# Patient Record
Sex: Female | Born: 1975 | Race: Black or African American | Hispanic: No | Marital: Single | Smoking: Never smoker
Health system: Southern US, Community
[De-identification: ages and names within clinical notes are randomized; demographics above are authoritative.]

## PROBLEM LIST (undated history)

## (undated) DIAGNOSIS — F419 Anxiety disorder, unspecified: Secondary | ICD-10-CM

## (undated) DIAGNOSIS — E079 Disorder of thyroid, unspecified: Secondary | ICD-10-CM

## (undated) DIAGNOSIS — D649 Anemia, unspecified: Secondary | ICD-10-CM

## (undated) DIAGNOSIS — G43909 Migraine, unspecified, not intractable, without status migrainosus: Secondary | ICD-10-CM

## (undated) HISTORY — DX: Disorder of thyroid, unspecified: E07.9

## (undated) HISTORY — DX: Migraine, unspecified, not intractable, without status migrainosus: G43.909

## (undated) HISTORY — DX: Anemia, unspecified: D64.9

## (undated) HISTORY — DX: Anxiety disorder, unspecified: F41.9

## (undated) HISTORY — PX: TUBAL LIGATION: SHX77

---

## 2014-12-24 ENCOUNTER — Encounter: Payer: Self-pay | Admitting: Certified Nurse Midwife

## 2015-02-23 ENCOUNTER — Ambulatory Visit (INDEPENDENT_AMBULATORY_CARE_PROVIDER_SITE_OTHER): Payer: 59 | Admitting: Nurse Practitioner

## 2015-02-23 ENCOUNTER — Encounter: Payer: Self-pay | Admitting: Nurse Practitioner

## 2015-02-23 VITALS — BP 122/80 | HR 64 | Resp 16 | Ht 70.0 in | Wt 167.0 lb

## 2015-02-23 DIAGNOSIS — D509 Iron deficiency anemia, unspecified: Secondary | ICD-10-CM | POA: Diagnosis not present

## 2015-02-23 DIAGNOSIS — N760 Acute vaginitis: Secondary | ICD-10-CM | POA: Diagnosis not present

## 2015-02-23 DIAGNOSIS — N920 Excessive and frequent menstruation with regular cycle: Secondary | ICD-10-CM | POA: Diagnosis not present

## 2015-02-23 DIAGNOSIS — R319 Hematuria, unspecified: Secondary | ICD-10-CM | POA: Diagnosis not present

## 2015-02-23 DIAGNOSIS — Z113 Encounter for screening for infections with a predominantly sexual mode of transmission: Secondary | ICD-10-CM | POA: Diagnosis not present

## 2015-02-23 DIAGNOSIS — N390 Urinary tract infection, site not specified: Secondary | ICD-10-CM

## 2015-02-23 DIAGNOSIS — Z01419 Encounter for gynecological examination (general) (routine) without abnormal findings: Secondary | ICD-10-CM

## 2015-02-23 DIAGNOSIS — Z0000 Encounter for general adult medical examination without abnormal findings: Secondary | ICD-10-CM

## 2015-02-23 LAB — CBC
HEMATOCRIT: 31.1 % — AB (ref 36.0–46.0)
HEMOGLOBIN: 10.2 g/dL — AB (ref 12.0–15.0)
MCH: 24.1 pg — ABNORMAL LOW (ref 26.0–34.0)
MCHC: 32.8 g/dL (ref 30.0–36.0)
MCV: 73.3 fL — ABNORMAL LOW (ref 78.0–100.0)
MPV: 8.8 fL (ref 8.6–12.4)
Platelets: 330 10*3/uL (ref 150–400)
RBC: 4.24 MIL/uL (ref 3.87–5.11)
RDW: 18.1 % — AB (ref 11.5–15.5)
WBC: 7.8 10*3/uL (ref 4.0–10.5)

## 2015-02-23 LAB — POCT URINALYSIS DIPSTICK
Bilirubin, UA: NEGATIVE
GLUCOSE UA: NEGATIVE
Ketones, UA: NEGATIVE
NITRITE UA: POSITIVE
PROTEIN UA: NEGATIVE
UROBILINOGEN UA: NEGATIVE
pH, UA: 7

## 2015-02-23 LAB — IBC PANEL
%SAT: 9 % — ABNORMAL LOW (ref 11–50)
TIBC: 441 ug/dL (ref 250–450)
UIBC: 402 ug/dL — ABNORMAL HIGH (ref 125–400)

## 2015-02-23 LAB — IRON: Iron: 39 ug/dL — ABNORMAL LOW (ref 40–190)

## 2015-02-23 LAB — FERRITIN: Ferritin: 6 ng/mL — ABNORMAL LOW (ref 10–291)

## 2015-02-23 MED ORDER — NITROFURANTOIN MONOHYD MACRO 100 MG PO CAPS: 100.0000 mg | ORAL_CAPSULE | Freq: Two times a day (BID) | ORAL | Status: DC

## 2015-02-23 NOTE — Progress Notes (Signed)
Patient ID: Terry Tran, female   DOB: 1975/12/18, 39 y.o.   MRN: [id number] 39 y.o. [id number] Single  African American Fe here for NGYN annual exam.  Menses is usually 7 days. Heavy for 4 days with using super plus tampon and pad with changing every 2 hours.  Cramps for 2 days.  Aleve OTC with some relief. Recently will have to miss work or activities due to heavy bleeding.  Having to change clothing due to accidents.  Having a lot stress over loosing custody of 14 year old daughter.  With all the stress she had no menses in June.  She is post BTL.  Then had normal menses in July.  She then was with the father of her son (age 60) whom they have always remained in close contact and have a good relationship.  Then SA with him on 7/29 & 7/30 with increase pain at the introitus and with deep penetration.  Now with UTI symptoms and vaginal discharge.  Wants to get STD's. Did use condoms but came off. He is in the [employer] and she is unsure of his sexual history.  Patient's last menstrual period was 12/27/2014 (exact date).          Sexually active: No. Not currently. The current method of family planning is tubal ligation and condoms all of the time.    Exercising: Yes.    cardio, sit up and squats for 60 minutes five days per week Smoker:  no  Health Maintenance: Pap:  2012, normal, no history of abnormal pap TDaP:  12/28/14 Labs:  Dr. Barrie Folk  Urine:  2+ leuk's, pos Nitrites, trace RBC   reports that she has never smoked. She has never used smokeless tobacco. She reports that she drinks about 1.2 oz of alcohol per week. She reports that she does not use illicit drugs.  Past Medical History  Diagnosis Date  . Anemia   . Anxiety     Past Surgical History  Procedure Laterality Date  . Tubal ligation      Current Outpatient Prescriptions  Medication Sig Dispense Refill  . nitrofurantoin, macrocrystal-monohydrate, (MACROBID) 100 MG capsule Take 1 capsule (100 mg total) by mouth 2 (two) times  daily. 14 capsule 0  . sertraline (ZOLOFT) 50 MG tablet Take 1 tablet by mouth daily.  1  . traZODone (DESYREL) 50 MG tablet Take 1 tablet by mouth at bedtime as needed.  0   No current facility-administered medications for this visit.    No family history on file.  ROS:  Pertinent items are noted in HPI.  Otherwise, a comprehensive ROS was negative.  Exam:   BP 122/80 mmHg  Pulse 64  Resp 16  Ht 5\' 10"  (1.778 m)  Wt 167 lb (75.751 kg)  BMI 23.96 kg/m2  LMP 12/27/2014 (Exact Date) Height: 5\' 10"  (177.8 cm) Ht Readings from Last 3 Encounters:  02/23/15 5\' 10"  (1.778 m)    General appearance: alert, cooperative and appears stated age Head: Normocephalic, without obvious abnormality, atraumatic Neck: no adenopathy, supple, symmetrical, trachea midline and thyroid normal to inspection and palpation Lungs: clear to auscultation bilaterally Breasts: normal appearance, no masses or tenderness Heart: regular rate and rhythm Abdomen: soft, non-tender; no masses,  no organomegaly Extremities: extremities normal, atraumatic, no cyanosis or edema Skin: Skin color, texture, turgor normal. No rashes or lesions Lymph nodes: Cervical, supraclavicular, and axillary nodes normal. No abnormal inguinal nodes palpated Neurologic: Grossly normal   Pelvic: External genitalia:  no lesions  Urethra:  normal appearing urethra with no masses, tenderness or lesions              Bartholin's and Skene's: normal                 Vagina: normal appearing vagina with normal color and discharge, no lesions              Cervix: anteverted              Pap taken: Yes.   Bimanual Exam:  Uterus:  normal size, contour, position, consistency, mobility, non-tender              Adnexa: normal adnexa and positive for: tenderness but minimal over the bladder               Rectovaginal: Confirms               Anus:  normal sphincter tone, no lesions  Chaperone present: yes  A:  Well Woman with normal  exam  History of menorrhagia  S/P BTL  R/O STD's  UTI  Recent emotional stressors  History of anemia secondary to menorrhagia  P:   Reviewed health and wellness pertinent to exam  Pap smear as above  Mammogram is due age 77  Treated for UTI with Macrobid and will follow with urine C&S  Will follow with other labs, STD's, wet prep and anemia -she may get OTC Slow Fe  Discussed at length causes of menorrhagia including uterine fibroids.  Also discussed treatment options to help with menorrhagia including: OCP, IUD, Nexplanon, and ablation.  She is given information about HTA  Will order PUS/ SHGM with endo biopsy and follow.  She most likely will want a non hormonal option such as HTA  Counseled on breast self exam, mammography screening, adequate intake of calcium and vitamin D, diet and exercise, Kegel's exercises return annually or prn  An After Visit Summary was printed and given to the patient.

## 2015-02-23 NOTE — Patient Instructions (Addendum)

## 2015-02-24 LAB — URINALYSIS, MICROSCOPIC ONLY
CASTS: NONE SEEN [LPF]
Crystals: NONE SEEN [HPF]
YEAST: NONE SEEN [HPF]

## 2015-02-24 LAB — STD PANEL
HEP B S AG: NEGATIVE
HIV: NONREACTIVE

## 2015-02-24 LAB — WET PREP BY MOLECULAR PROBE
CANDIDA SPECIES: NEGATIVE
GARDNERELLA VAGINALIS: POSITIVE — AB
TRICHOMONAS VAG: NEGATIVE

## 2015-02-24 LAB — HEPATITIS C ANTIBODY: HCV Ab: NEGATIVE

## 2015-02-25 ENCOUNTER — Other Ambulatory Visit: Payer: Self-pay | Admitting: Nurse Practitioner

## 2015-02-25 LAB — IPS N GONORRHOEA AND CHLAMYDIA BY PCR

## 2015-02-25 LAB — IPS PAP TEST WITH HPV

## 2015-02-25 MED ORDER — METRONIDAZOLE 0.75 % VA GEL: 1.0000 | Freq: Every day | VAGINAL | Status: DC

## 2015-02-26 LAB — URINE CULTURE: Colony Count: >=100000

## 2015-02-27 NOTE — Progress Notes (Signed)
Encounter reviewed by Dr. Brook Amundson C. Silva.  

## 2015-03-07 ENCOUNTER — Telehealth: Payer: Self-pay | Admitting: Nurse Practitioner

## 2015-03-07 DIAGNOSIS — N920 Excessive and frequent menstruation with regular cycle: Secondary | ICD-10-CM

## 2015-03-07 NOTE — Telephone Encounter (Signed)
Called patient to review benefits for procedure. Left voicemail to call back and review. Attempting to schedule with Dr Quincy Simmonds 03/10/15 for SHGM/emb

## 2015-03-07 NOTE — Telephone Encounter (Signed)
Reviewed message.  Order placed for Sonohysterogram with Dr. Quincy Simmonds.  Patient with history of tubal ligation.  Routing to provider for final review. Patient agreeable to disposition. Will close encounter.

## 2015-03-07 NOTE — Telephone Encounter (Signed)
Spoke with patient. Reviewed benefit for procedure. Patient understood and agreeable. Scheduled 03/24/15 and verified arrival date/time for [facility] and endometrial biopsy procedure. Patient agreeable. Reviewed 72 hour cancellation policy with $753 non refundable fee. Patient understands and agreeable. Patient states she is unsure when her next cycle will start. Advised to let us know when starts but keep the 72 hour window in mind. Routing to nurse to review prior to closing.

## 2015-03-24 ENCOUNTER — Other Ambulatory Visit: Payer: 59 | Admitting: Obstetrics and Gynecology

## 2015-03-24 ENCOUNTER — Other Ambulatory Visit: Payer: 59

## 2015-04-11 ENCOUNTER — Telehealth: Payer: Self-pay | Admitting: Obstetrics and Gynecology

## 2015-04-11 NOTE — Telephone Encounter (Signed)
Thank you for the update.  Please keep in work que.   Cc- Rinaldo Ratel.

## 2015-04-11 NOTE — Telephone Encounter (Signed)
Patient canceled her ultrasound for 04/14/15. She states she will call back after the new year to reschedule this appointment.

## 2015-04-14 ENCOUNTER — Other Ambulatory Visit: Payer: 59

## 2015-04-14 ENCOUNTER — Other Ambulatory Visit: Payer: 59 | Admitting: Obstetrics and Gynecology

## 2015-05-04 ENCOUNTER — Telehealth: Payer: Self-pay | Admitting: Nurse Practitioner

## 2015-05-04 NOTE — Telephone Encounter (Addendum)
Patient wants to schedule an appointment for a ultrasound. She would like to schedule for January 2017. She originally had this scheduled but canceled.

## 2015-05-13 NOTE — Telephone Encounter (Signed)
Patient calling to check on status of ultrasound that she would like to have scheduled. [phone]

## 2015-06-30 ENCOUNTER — Ambulatory Visit (INDEPENDENT_AMBULATORY_CARE_PROVIDER_SITE_OTHER): Payer: 59 | Admitting: Obstetrics and Gynecology

## 2015-06-30 ENCOUNTER — Other Ambulatory Visit: Payer: Self-pay | Admitting: Obstetrics and Gynecology

## 2015-06-30 ENCOUNTER — Encounter: Payer: Self-pay | Admitting: Obstetrics and Gynecology

## 2015-06-30 ENCOUNTER — Ambulatory Visit (INDEPENDENT_AMBULATORY_CARE_PROVIDER_SITE_OTHER): Payer: 59

## 2015-06-30 VITALS — BP 120/76 | HR 64 | Ht 70.0 in | Wt 167.0 lb

## 2015-06-30 DIAGNOSIS — D259 Leiomyoma of uterus, unspecified: Secondary | ICD-10-CM

## 2015-06-30 DIAGNOSIS — N946 Dysmenorrhea, unspecified: Secondary | ICD-10-CM | POA: Diagnosis not present

## 2015-06-30 DIAGNOSIS — N920 Excessive and frequent menstruation with regular cycle: Secondary | ICD-10-CM | POA: Diagnosis not present

## 2015-06-30 LAB — CBC
HEMATOCRIT: 31.9 % — AB (ref 36.0–46.0)
HEMOGLOBIN: 10.7 g/dL — AB (ref 12.0–15.0)
MCH: 23.9 pg — AB (ref 26.0–34.0)
MCHC: 33.5 g/dL (ref 30.0–36.0)
MCV: 71.4 fL — ABNORMAL LOW (ref 78.0–100.0)
MPV: 8.9 fL (ref 8.6–12.4)
Platelets: 352 10*3/uL (ref 150–400)
RBC: 4.47 MIL/uL (ref 3.87–5.11)
RDW: 18.6 % — ABNORMAL HIGH (ref 11.5–15.5)
WBC: 11.3 10*3/uL — AB (ref 4.0–10.5)

## 2015-06-30 NOTE — Progress Notes (Signed)
Pt was given 800mg  Ibuprofen per verbal OK by Dr. Quincy Simmonds.  Pt was also given crackers as she has not eaten yet today.

## 2015-06-30 NOTE — Progress Notes (Signed)
Subjective  40 y.o. [id number]  African American  female here for pelvic ultrasound for heavy menses life long.  Considering endometrial ablation.   Used to have malaise and nausea with fever as a young woman.  Now cycles are preceded by headaches a week before her cycles come on.  Has "really bad cramps." Using Aleve, 2  every 4 hoours for first 3 days.  Cramping wakes patient up.  Back and "stomach" pains, which can prevent patient from going to work.  Has left work due to heaviness of flow.  Hgb 10.2 on 02/23/15. Can skip cycles.  Skipped in July. Does not remember August or September cycle or not.  Was under stress due to custody battle.   Status post BTL.  Never took birth control.  No hx of HTN, migraines with aura, personal or family hx of thromboembolic events.  Declines future childbearing.  Has had two vaginal deliveries.   Objective  Pelvic ultrasound images and report reviewed with patient.  Uterus - 10 mm subserosal fibroid.  Appearance consistent with adenomyosis.  EMS - 11.37 mm.  Echogenic focus 7 mm. Ovaries - bilateral follicles.  No masses. Free fluid - no  Procedure - sonohysterogram Consent performed. Speculum placed in vagina. Sterile prep of cervix with  betadine Cannula placed inside endometrial cavity. Speculum removed. Sterile saline injected.        small      filling defect noted but suboptimal distension.  Cannula removed and new cannula placed. Balloon tip canula with resistance.  Sponge tip cannula with slightly better dilation of uterine cavity.  No complication.   Procedure - endometrial biopsy Consent performed. Speculum place in vagina.  Sterile prep of cervix with  Betadine. Tenaculum to anterior cervical lip. Pipelle placed to 8 cm without difficulty twice. Tissue obtained and sent to pathology. Speculum removed.  No complications. Minimal EBL.  Assessment  Menorrhagia.  Hx anemia. Dysmenorrhea. Small fibroid. Status post  BTL.  Plan  CBC. Follow up EMB.  Instructions and precautions given.  Discussion of ultrasound findings - fibroid, endometrial thickening and possible polyp, possible adenomyosis.  Comprehensive discussion of options for care - contraceptive methods - pills, Depo Provera, Mirena IUD, Nexplanon; hysterectomy. I do not recommend endometrial ablation due to dysmenorrhea and possible adenomyosis.  Robotic total laparoscopic hysterectomy with bilateral salpingectomy and possible bilateral oophorectomy discussed. Handouts also given.   Patient states she may consider future hysterectomy.  __25_____ minutes face to face time of which over 50% was spent in counseling.   After visit summary to patient.

## 2015-07-01 NOTE — Addendum Note (Signed)
Addended by: Yisroel Ramming, BROOK E on: 07/01/2015 03:05 PM   Modules accepted: Orders

## 2015-07-06 LAB — IPS OTHER TISSUE BIOPSY

## 2015-07-08 ENCOUNTER — Telehealth: Payer: Self-pay

## 2015-07-08 NOTE — Telephone Encounter (Signed)
Left message to call Andilynn Delavega at [phone]. 

## 2015-07-08 NOTE — Telephone Encounter (Signed)
-----   Message from Nunzio Cobbs, MD sent at 07/07/2015  2:37 PM EST ----- Please report to patient her EMB results which showed proliferative effect (estrogenic effect) on the lining.  No polyp, hyperplasia, or cancer seen.  Patient considering future hysterectomy.  Let me know how I can help.  Cc- Marisa Sprinkles

## 2015-07-12 NOTE — Telephone Encounter (Signed)
Left message to call Kaitlyn at [phone]. 

## 2015-07-12 NOTE — Telephone Encounter (Signed)
Spoke with patient. Advised of results as seen below from [place]. Patient is agreeable and verbalizes understanding. She has decided not to proceed with a hysterectomy and would like to know what her alternative are to help with heavy bleeding and cramping with her menses. "My friend told me that her Gynecologist prescribed her Tramadol and Tranexamic Acid. Are these something I could try?" Advised I will Speak with Dr.Silva regarding her recommendations and return call. Patient is aware Dr.Silva is out of the office today.

## 2015-07-13 MED ORDER — TRANEXAMIC ACID 650 MG PO TABS (ORTHO): ORAL_TABLET | ORAL | Status: DC

## 2015-07-13 NOTE — Telephone Encounter (Signed)
Thanks for the update

## 2015-07-13 NOTE — Telephone Encounter (Signed)
Spoke with patient. Advised of message as seen below from [place]. Patient verbalizes understanding. Patient would like to try Lysteda with her cycles to see how beneficial this is. She will contact the office with any concerns or questions. Aware of the increased risk of thromboembolic events. Rx for Lysteda 650 mg take 2 po tid during menses for max of 5 days #30 5RF printed to [place] for signature for fax. Marland Kitchen She will contact the office if she would like to consider having a Mirena IUD placed.

## 2015-07-13 NOTE — Telephone Encounter (Signed)
Left message to call Kaitlyn at [phone]. 

## 2015-07-13 NOTE — Telephone Encounter (Signed)
Terry Tran is an option to help control the bleeding.  It needs to be taken with each menses.  Lysteda does increase risk of thromboembolic events. OK for 650 mg tabs, Take 2 po tid during menses for max of 5 days.   Dispense:  30, RF:  5.  With respect to the Tramadol, this is a medication that is very strong and has addictive properties and cause dependence.  I do not usually prescribe this medication to control pain with monthly menses.    It would be so much better to do a preventive therapy!  A Mirena IUD may be a good option to control both pain and bleeding.  It is low hormonal exposure as well. Compared to other contraceptive methods.  Hormonal contraception can reduce both bleeding and pain with menses.  With the Mirena, many women have very light or even absent menses.  Let me know how I can help.

## 2015-07-21 ENCOUNTER — Other Ambulatory Visit: Payer: Self-pay | Admitting: *Deleted

## 2015-07-21 MED ORDER — TRANEXAMIC ACID 650 MG PO TABS (ORTHO): ORAL_TABLET | ORAL | Status: DC

## 2015-07-29 ENCOUNTER — Telehealth: Payer: Self-pay | Admitting: Nurse Practitioner

## 2015-07-29 MED ORDER — TRANEXAMIC ACID 650 MG PO TABS (ORTHO): ORAL_TABLET | ORAL | Status: DC

## 2015-07-29 NOTE — Telephone Encounter (Signed)
Patient called and said, "I just got a notice from [facility] to contact my physician's office about my prescription for tranexamic acid."

## 2015-07-29 NOTE — Telephone Encounter (Signed)
Spoke with patient. Advised rx for Tranexamic Acid has been faxed over to [facility] at [phone] with cover sheet and confirmation. She will contact [facility] to ensure they are able to process her prescription at this time. She will notify the office if anything further is needed.  Routing to provider for final review. Patient agreeable to disposition. Will close encounter.

## 2015-07-29 NOTE — Telephone Encounter (Signed)
Spoke with patient. She reports that originally her rx for Tranexamic acid was sent to [facility]. States she requested this be sent to [facility] due to cost. Reports she was notified by [facility] yesterday that she needed to contact our office for approval of rx to be filled via mail order. Advised this request was received on 1/26 and was approved. She reports she was advised a new prescription will need to be faxed to [facility] at [phone] for this to be processed. Asked the patient if this medication needs a prior authorization with her insurance company. She denies stating "It is covered because I could get it filled when it was at [facility] it was just going to be more expensive"   Dr.Silva, Rx has been reprinted and to you for review and signature for fax to [facility].

## 2015-10-12 ENCOUNTER — Telehealth: Payer: Self-pay | Admitting: *Deleted

## 2015-10-12 NOTE — Telephone Encounter (Signed)
Spoke with patient. Patient states that she would like to start OCP at this time. Reports she discussed this with Dr.Silva at her appointment on 06/30/2015. States she is still having painful cycles and would like to try OCP for regulation of her cycles and discomfort relief. Advised I will speak with Dr.Silva regarding her request and return call with further recommendations. She is agreeable.

## 2015-10-12 NOTE — Telephone Encounter (Signed)
Patient wanting Dr. Quincy Simmonds to prescribe birth control that she and her talked about. [facility] # to reach: [phone]

## 2015-10-12 NOTE — Telephone Encounter (Signed)
Patient has never taken a birth control pill before.  Please schedule an office visit so I can discuss pills with her. I can give her a prescription then.

## 2015-10-13 NOTE — Telephone Encounter (Signed)
Spoke with patient. Advised of message as seen below from [place]. She is agreeable and verbalizes understanding. Appointment scheduled for 10/14/2015 at 3:30 pm with Dr.Silva. She is agreeable to date and time.  Routing to provider for final review. Patient agreeable to disposition. Will close encounter.

## 2015-10-14 ENCOUNTER — Encounter: Payer: Self-pay | Admitting: Obstetrics and Gynecology

## 2015-10-14 ENCOUNTER — Ambulatory Visit (INDEPENDENT_AMBULATORY_CARE_PROVIDER_SITE_OTHER): Payer: 59 | Admitting: Obstetrics and Gynecology

## 2015-10-14 VITALS — BP 132/74 | HR 84 | Ht 70.0 in | Wt 165.0 lb

## 2015-10-14 DIAGNOSIS — N920 Excessive and frequent menstruation with regular cycle: Secondary | ICD-10-CM | POA: Diagnosis not present

## 2015-10-14 DIAGNOSIS — N946 Dysmenorrhea, unspecified: Secondary | ICD-10-CM | POA: Diagnosis not present

## 2015-10-14 NOTE — Progress Notes (Signed)
Patient ID: Terry Tran, female   DOB: 10-May-1976, 40 y.o.   MRN: [id number] GYNECOLOGY  VISIT   HPI: 40 y.o.   Single  African American  female   [phone] with Patient's last menstrual period was 10/09/2015 (exact date).   here for consultation regarding beginning OCPs for dysmenorrhea/menorrhagia.    Pajamas soaked with blood with her last menses. Pad shifted at night.   Ultrasound in January 2017 showing possible adenomyosis and small submucous fibroid.  Possible 7 mm intracavitary area but not good image on the sonohysterogram.  EMB was benign and normal.   Considering Mirena.   Not taking her iron.   GYNECOLOGIC HISTORY: Patient's last menstrual period was 10/09/2015 (exact date). Contraception:  Tubal Menopausal hormone therapy:  none Last mammogram:  Never Last pap smear:   02-23-15 Neg:Neg HR HPV        OB History    Gravida Para Term Preterm AB TAB SAB Ectopic Multiple Living   2 2 2  0 0 0 0 0 0 2         There are no active problems to display for this patient.   Past Medical History  Diagnosis Date  . Anemia   . Anxiety     Past Surgical History  Procedure Laterality Date  . Tubal ligation      Current Outpatient Prescriptions  Medication Sig Dispense Refill  . sertraline (ZOLOFT) 50 MG tablet Take 1 tablet by mouth daily.  1  . tranexamic acid (LYSTEDA) 650 mg TABS tablet Take 2 po tid during menses for max of 5 days. 90 tablet 2  . traZODone (DESYREL) 50 MG tablet Take 1 tablet by mouth at bedtime as needed.  0   No current facility-administered medications for this visit.     ALLERGIES: Review of patient's allergies indicates no known allergies.  No family history on file.  Social History   Social History  . Marital Status: Single    Spouse Name: N/A  . Number of Children: N/A  . Years of Education: N/A   Occupational History  . Not on file.   Social History Main Topics  . Smoking status: Never Smoker   . Smokeless tobacco: Never  Used  . Alcohol Use: 1.2 oz/week    0 Standard drinks or equivalent, 2 Glasses of wine per week  . Drug Use: No  . Sexual Activity:    Partners: Male    Birth Control/ Protection: Condom, Surgical     Comment: BTL   Other Topics Concern  . Not on file   Social History Narrative    ROS:  Pertinent items are noted in HPI.  PHYSICAL EXAMINATION:    BP 132/74 mmHg  Pulse 84  Ht 5\' 10"  (1.778 m)  Wt 165 lb (74.844 kg)  BMI 23.68 kg/m2  LMP 10/09/2015 (Exact Date)    General appearance: alert, cooperative and appears stated age   ASSESSMENT  Menorrhagia.  Dysmenorrhea.  Small fibroid.  Suspected adenomyosis.  Status post BTL.   PLAN  Check Hgb - 10.8.  Encouraged to take her iron.  Discussion of options for care - OCPS, NuvaRing, Ortho Evra, Mirena IUD, Nexplanon, Depo Provera.  Detailed discussion of Mirena - risks and benefits.  Will proceed with precert of the Mirena.  Patient understands that this is not for contraception as she has had a tubal ligation.    An After Visit Summary was printed and given to the patient.  _15_____ minutes face to face time  of which over 50% was spent in counseling.

## 2015-10-17 ENCOUNTER — Telehealth: Payer: Self-pay | Admitting: Obstetrics and Gynecology

## 2015-10-17 ENCOUNTER — Encounter: Payer: Self-pay | Admitting: Obstetrics and Gynecology

## 2015-10-17 LAB — HEMOGLOBIN, FINGERSTICK: HEMOGLOBIN, FINGERSTICK: 10.8 g/dL — AB (ref 12.0–16.0)

## 2015-10-17 NOTE — Telephone Encounter (Signed)
Spoke with patient. Reviewed benefits for IUD insertion. Patient agreeable and ready to schedule. Per Verline Lema ok to schedule at patients convenience. Patient scheduled for Thursday 10/20/15 @ 3pm with Dr Quincy Simmonds. Reviewed 72 hour cancellation policy. Patient understood and agreeable. No further questions. Ok to close.

## 2015-10-20 ENCOUNTER — Encounter: Payer: Self-pay | Admitting: Obstetrics and Gynecology

## 2015-10-20 ENCOUNTER — Ambulatory Visit (INDEPENDENT_AMBULATORY_CARE_PROVIDER_SITE_OTHER): Payer: 59 | Admitting: Obstetrics and Gynecology

## 2015-10-20 VITALS — BP 110/70 | HR 80 | Ht 70.0 in | Wt 165.0 lb

## 2015-10-20 DIAGNOSIS — N946 Dysmenorrhea, unspecified: Secondary | ICD-10-CM

## 2015-10-20 DIAGNOSIS — Z30430 Encounter for insertion of intrauterine contraceptive device: Secondary | ICD-10-CM | POA: Diagnosis not present

## 2015-10-20 DIAGNOSIS — N920 Excessive and frequent menstruation with regular cycle: Secondary | ICD-10-CM

## 2015-10-20 NOTE — Patient Instructions (Signed)

## 2015-10-20 NOTE — Progress Notes (Signed)
Patient ID: Terry Tran, female   DOB: 12-24-1975, 40 y.o.   MRN: [id number] GYNECOLOGY  VISIT   HPI: 40 y.o.   Single  African American  female   [phone] with Patient's last menstrual period was 10/09/2015 (exact date).   here for Mirena IUD Insertion.    Ultrasound in January 2017 showing possible adenomyosis and small submucous fibroid.  Possible 7 mm intracavitary area but not good image on the sonohysterogram.  EMB was benign and normal.  Has dysmenorrhea and menorrhagia.   Take slow Fe and PNV with fe.  Hgb 10.8 at last visit.   GYNECOLOGIC HISTORY: Patient's last menstrual period was 10/09/2015 (exact date). Contraception:  Tubal Menopausal hormone therapy:  n/a Last mammogram:  NEVER Last pap smear:   02-23-15 Neg:Neg HR HPV        OB History    Gravida Para Term Preterm AB TAB SAB Ectopic Multiple Living   2 2 2  0 0 0 0 0 0 2         There are no active problems to display for this patient.   Past Medical History  Diagnosis Date  . Anemia   . Anxiety     Past Surgical History  Procedure Laterality Date  . Tubal ligation      Current Outpatient Prescriptions  Medication Sig Dispense Refill  . sertraline (ZOLOFT) 50 MG tablet Take 1 tablet by mouth daily.  1  . tranexamic acid (LYSTEDA) 650 mg TABS tablet Take 2 po tid during menses for max of 5 days. 90 tablet 2  . traZODone (DESYREL) 50 MG tablet Take 1 tablet by mouth at bedtime as needed.  0   No current facility-administered medications for this visit.     ALLERGIES: Review of patient's allergies indicates no known allergies.  No family history on file.  Social History   Social History  . Marital Status: Single    Spouse Name: N/A  . Number of Children: N/A  . Years of Education: N/A   Occupational History  . Not on file.   Social History Main Topics  . Smoking status: Never Smoker   . Smokeless tobacco: Never Used  . Alcohol Use: 1.2 oz/week    0 Standard drinks or equivalent,  2 Glasses of wine per week  . Drug Use: No  . Sexual Activity:    Partners: Male    Birth Control/ Protection: Condom, Surgical     Comment: BTL   Other Topics Concern  . Not on file   Social History Narrative    ROS:  Pertinent items are noted in HPI.  PHYSICAL EXAMINATION:    BP 110/70 mmHg  Pulse 80  Ht 5\' 10"  (1.778 m)  Wt 165 lb (74.844 kg)  BMI 23.68 kg/m2  LMP 10/09/2015 (Exact Date)    General appearance: alert, cooperative and appears stated age   Pelvic: External genitalia:  no lesions              Urethra:  normal appearing urethra with no masses, tenderness or lesions              Bartholins and Skenes: normal                 Vagina: normal appearing vagina with normal color and discharge, no lesions              Cervix: no lesions          Bimanual Exam:  Uterus:  normal size,  contour, position, consistency, mobility, non-tender              Adnexa: normal adnexa and no mass, fullness, tenderness          Consent for Mirena IUD insertion.  Lot  [id number], expiration 09/19. Speculum placed in vagina.  Sterile prep of cervix with Hibiblens.  Paracervical block with 10 cc 1% lidocaine - lot  [id number], expiration  08/23/16. Tenaculum to anterior cervical lip.  Uterus sounded to  8  cm.  Mirena IUD placed without difficulty.  Strings trimmed.  No complications.  Minimal EBL.   ASSESSMENT  [place] IUD insertion for menorrhagia and dysmenorrhea. Anemia.   PLAN  Instructions and precautions given.  Patient is prepared for potential irregular menses due to the Mirena IUD. Card given to patient with insertion date, recommended removal date, and lot number.  Discused food rich in iron.  Continue Fe. Follow up for a recheck in 5 weeks, sooner as needed.   An After Visit Summary was printed and given to the patient.

## 2015-11-24 ENCOUNTER — Ambulatory Visit (INDEPENDENT_AMBULATORY_CARE_PROVIDER_SITE_OTHER): Payer: 59 | Admitting: Obstetrics and Gynecology

## 2015-11-24 ENCOUNTER — Encounter: Payer: Self-pay | Admitting: Obstetrics and Gynecology

## 2015-11-24 VITALS — BP 110/68 | HR 70 | Ht 70.0 in | Wt 167.0 lb

## 2015-11-24 DIAGNOSIS — D649 Anemia, unspecified: Secondary | ICD-10-CM

## 2015-11-24 DIAGNOSIS — Z30431 Encounter for routine checking of intrauterine contraceptive device: Secondary | ICD-10-CM

## 2015-11-24 NOTE — Progress Notes (Signed)
Patient ID: Terry Tran, female   DOB: 11-Oct-1975, 40 y.o.   MRN: [id number] GYNECOLOGY  VISIT   HPI: 40 y.o.   Single  African American  female   [phone] with Patient's last menstrual period was 11/04/2015 (exact date).   here for 5 week IUD follow up.   Mirena placed on 10/20/15.  Has menorrhagia, dysmenorrhea, small submucous fibroid?, and possible adenomyosis. Had pelvic ultrasound on 06/30/15 which suggested possible 6 mm submucous mass. Sonohysterogram performed and difficulty to have good expansion of the uterine cavity despite changing catheter types.  No definitive focus easily identified.  Had negative EMB showing proliferative endometrium.  Last menstrual cycle was not as heavy and cramping was less. Cycle lasted 7 days total. Able to get out of bed because the pain was so much less!  Her family has even noted the difference in her well being. Took 2 Aleve in one day.  Usually would take 5 Aleve in a day. Patient is very pleased and wondering why she waited to long the have a Mirena.  Has anemia.   Takes her iron (SoFe) sometimes.  Has constipation, so alternates with PNV.  Not sexually active since IUD placed.  GYNECOLOGIC HISTORY: Patient's last menstrual period was 11/04/2015 (exact date). Contraception:  Tubal/Mirena IUD inserted 10-20-15 Menopausal hormone therapy:  n/a Last mammogram:  n/a Last pap smear:   02-23-15 Neg:Neg HR HPV        OB History    Gravida Para Term Preterm AB TAB SAB Ectopic Multiple Living   2 2 2  0 0 0 0 0 0 2         There are no active problems to display for this patient.   Past Medical History  Diagnosis Date  . Anemia   . Anxiety     Past Surgical History  Procedure Laterality Date  . Tubal ligation      Current Outpatient Prescriptions  Medication Sig Dispense Refill  . amoxicillin (AMOXIL) 875 MG tablet Take 875 mg by mouth 2 (two) times daily.    Marland Kitchen escitalopram (LEXAPRO) 10 MG tablet Take 10 mg by mouth daily.     Marland Kitchen levonorgestrel (MIRENA) 20 MCG/24HR IUD by Intrauterine route.    . traZODone (DESYREL) 50 MG tablet Take 1 tablet by mouth at bedtime as needed.  0   No current facility-administered medications for this visit.     ALLERGIES: Review of patient's allergies indicates no known allergies.  No family history on file.  Social History   Social History  . Marital Status: Single    Spouse Name: N/A  . Number of Children: N/A  . Years of Education: N/A   Occupational History  . Not on file.   Social History Main Topics  . Smoking status: Never Smoker   . Smokeless tobacco: Never Used  . Alcohol Use: 1.2 oz/week    0 Standard drinks or equivalent, 2 Glasses of wine per week  . Drug Use: No  . Sexual Activity:    Partners: Male    Birth Control/ Protection: Condom, IUD, Surgical     Comment: BTL/Mirena IUD inserted 10-20-15   Other Topics Concern  . Not on file   Social History Narrative    ROS:  Pertinent items are noted in HPI.  PHYSICAL EXAMINATION:    BP 110/68 mmHg  Pulse 70  Ht 5\' 10"  (1.778 m)  Wt 167 lb (75.751 kg)  BMI 23.96 kg/m2  LMP 11/04/2015 (Exact Date)    General  appearance: alert, cooperative and appears stated age.  Has a bright smile today.   Pelvic: External genitalia:  no lesions              Urethra:  normal appearing urethra with no masses, tenderness or lesions              Bartholins and Skenes: normal                 Vagina: normal appearing vagina with normal color and discharge, no lesions              Cervix: IUD strings noted.  No cervical lesions.          Bimanual Exam:  Uterus:  normal size, contour, position, consistency, mobility, non-tender              Adnexa: normal adnexa and no mass, fullness, tenderness            Chaperone was present for exam.  ASSESSMENT  Mirena IUD patient.  Menorrhagia and dysmenorrhea much improved.  Anemia.  Not taking iron consistently. Status post BTL.  PLAN  Discussion of Mirena IUD.  I  encouraged her to take a Flintstones vitamin with iron if this would improve her compliance in taking the vitamin.  I expect that her anemia will also improve because her menstrual bleeding is less.    An After Visit Summary was printed and given to the patient.  __15____ minutes face to face time of which over 50% was spent in counseling.

## 2016-02-28 ENCOUNTER — Ambulatory Visit: Payer: 59 | Admitting: Nurse Practitioner

## 2016-02-28 ENCOUNTER — Telehealth: Payer: Self-pay | Admitting: Nurse Practitioner

## 2016-02-28 NOTE — Telephone Encounter (Signed)
Patient canceled her aex today due to cycle started. Patient rescheduled to 03/05/16 at 1:45am Ronn Melena

## 2016-03-05 ENCOUNTER — Ambulatory Visit: Payer: 59 | Admitting: Nurse Practitioner

## 2016-03-19 ENCOUNTER — Encounter: Payer: Self-pay | Admitting: Nurse Practitioner

## 2016-03-19 ENCOUNTER — Ambulatory Visit (INDEPENDENT_AMBULATORY_CARE_PROVIDER_SITE_OTHER): Payer: 59 | Admitting: Nurse Practitioner

## 2016-03-19 ENCOUNTER — Other Ambulatory Visit: Payer: Self-pay | Admitting: Nurse Practitioner

## 2016-03-19 VITALS — BP 110/70 | HR 76 | Resp 16 | Ht 70.0 in | Wt 165.2 lb

## 2016-03-19 DIAGNOSIS — Z1231 Encounter for screening mammogram for malignant neoplasm of breast: Secondary | ICD-10-CM

## 2016-03-19 DIAGNOSIS — Z01419 Encounter for gynecological examination (general) (routine) without abnormal findings: Secondary | ICD-10-CM | POA: Diagnosis not present

## 2016-03-19 DIAGNOSIS — R8290 Unspecified abnormal findings in urine: Secondary | ICD-10-CM

## 2016-03-19 DIAGNOSIS — Z0000 Encounter for general adult medical examination without abnormal findings: Secondary | ICD-10-CM | POA: Diagnosis not present

## 2016-03-19 DIAGNOSIS — Z975 Presence of (intrauterine) contraceptive device: Secondary | ICD-10-CM | POA: Diagnosis not present

## 2016-03-19 LAB — POCT URINALYSIS DIPSTICK
Bilirubin, UA: NEGATIVE
Blood, UA: NEGATIVE
GLUCOSE UA: NEGATIVE
KETONES UA: NEGATIVE
NITRITE UA: NEGATIVE
Protein, UA: NEGATIVE
Urobilinogen, UA: NEGATIVE
pH, UA: 6

## 2016-03-19 NOTE — Progress Notes (Signed)
Patient ID: Terry Tran, female   DOB: 1976-05-09, 40 y.o.   MRN: [id number]  39 y.o. [id number] Single African American Fe here for annual exam.  Menses is much lighter but still last for a week.  Mirena IUD was inserted 10/20/15.  Now no cramps. Not dating or SA.  Patient's last menstrual period was 03/05/2016.          Sexually active: No.  The current method of family planning is tubal ligation, IUD and condoms most of the time.   Mirena placed 10/20/15. Exercising: Yes.    zumba, yoga,  Smoker:  no  Health Maintenance: Pap:02/23/15  Negative with neg HR HPV MMG: Never TDaP: 12/28/14 HIV: 02/23/15 Labs: PCP Urine: 1+ Leuk's, 6.0 pH   reports that she has never smoked. She has never used smokeless tobacco. She reports that she drinks about 1.2 oz of alcohol per week . She reports that she does not use drugs.  Past Medical History:  Diagnosis Date  . Anemia   . Anxiety     Past Surgical History:  Procedure Laterality Date  . TUBAL LIGATION      Current Outpatient Prescriptions  Medication Sig Dispense Refill  . escitalopram (LEXAPRO) 10 MG tablet Take 10 mg by mouth daily.    Marland Kitchen levonorgestrel (MIRENA) 20 MCG/24HR IUD by Intrauterine route.    . topiramate (TOPAMAX) 25 MG tablet Take 25 mg by mouth.    . traZODone (DESYREL) 50 MG tablet Take 1 tablet by mouth at bedtime as needed.  0   No current facility-administered medications for this visit.     No family history on file.  ROS:  Pertinent items are noted in HPI.  Otherwise, a comprehensive ROS was negative.  Exam:   BP 110/70 (BP Location: Right Arm, Patient Position: Sitting, Cuff Size: Normal)   Pulse 76   Resp 16   Ht 5\' 10"  (1.778 m)   Wt 165 lb 3.2 oz (74.9 kg)   LMP 03/05/2016   BMI 23.70 kg/m  Height: 5\' 10"  (177.8 cm) Ht Readings from Last 3 Encounters:  03/19/16 5\' 10"  (1.778 m)  11/24/15 5\' 10"  (1.778 m)  10/20/15 5\' 10"  (1.778 m)    General appearance: alert, cooperative and appears stated  age Head: Normocephalic, without obvious abnormality, atraumatic Neck: no adenopathy, supple, symmetrical, trachea midline and thyroid normal to inspection and palpation Lungs: clear to auscultation bilaterally Breasts: normal appearance, no masses or tenderness Heart: regular rate and rhythm Abdomen: soft, non-tender; no masses,  no organomegaly Extremities: extremities normal, atraumatic, no cyanosis or edema Skin: Skin color, texture, turgor normal. No rashes or lesions Lymph nodes: Cervical, supraclavicular, and axillary nodes normal. No abnormal inguinal nodes palpated Neurologic: Grossly normal   Pelvic: External genitalia:  no lesions              Urethra:  normal appearing urethra with no masses, tenderness or lesions              Bartholin's and Skene's: normal                 Vagina: normal appearing vagina with normal color and discharge, no lesions              Cervix: anteverted, IUD strings are visible              Pap taken: No. Bimanual Exam:  Uterus:  normal size, contour, position, consistency, mobility, non-tender  Adnexa: no mass, fullness, tenderness               Rectovaginal: Confirms               Anus:  normal sphincter tone, no lesions  Chaperone present: no  A:  Well Woman with normal exam  History of menorrhagia and dysmenorrhea - better with IUD  Mirena IUD 10/20/15  R/O UTI - asymptomatic   P:   Reviewed health and wellness pertinent to exam  Pap smear as above  Mammogram is due and name of [facility] is given  Follow with urine culture  Counseled on breast self exam, mammography screening, adequate intake of calcium and vitamin D, diet and exercise return annually or prn  An After Visit Summary was printed and given to the patient.

## 2016-03-19 NOTE — Patient Instructions (Signed)

## 2016-03-20 LAB — URINE CULTURE: Organism ID, Bacteria: NO GROWTH

## 2016-03-21 ENCOUNTER — Ambulatory Visit
Admission: RE | Admit: 2016-03-21 | Discharge: 2016-03-21 | Disposition: A | Discharge status: Home / Self Care | Payer: 59 | Source: Ambulatory Visit | Attending: Nurse Practitioner | Admitting: Nurse Practitioner

## 2016-03-21 DIAGNOSIS — Z1231 Encounter for screening mammogram for malignant neoplasm of breast: Secondary | ICD-10-CM

## 2016-03-22 NOTE — Progress Notes (Signed)
Encounter reviewed by Dr. Juwaun Inskeep Amundson C. Silva.  

## 2016-08-15 ENCOUNTER — Encounter: Payer: Self-pay | Admitting: Nurse Practitioner

## 2016-08-15 ENCOUNTER — Ambulatory Visit (INDEPENDENT_AMBULATORY_CARE_PROVIDER_SITE_OTHER): Payer: 59 | Admitting: Nurse Practitioner

## 2016-08-15 VITALS — Ht 70.0 in | Wt 175.0 lb

## 2016-08-15 DIAGNOSIS — R8290 Unspecified abnormal findings in urine: Secondary | ICD-10-CM | POA: Diagnosis not present

## 2016-08-15 DIAGNOSIS — R3915 Urgency of urination: Secondary | ICD-10-CM | POA: Diagnosis not present

## 2016-08-15 LAB — POCT URINALYSIS DIPSTICK
Bilirubin, UA: NEGATIVE
GLUCOSE UA: NEGATIVE
Ketones, UA: NEGATIVE
Nitrite, UA: POSITIVE
PROTEIN UA: NEGATIVE
UROBILINOGEN UA: NEGATIVE
pH, UA: 8

## 2016-08-15 MED ORDER — NITROFURANTOIN MONOHYD MACRO 100 MG PO CAPS: ORAL_CAPSULE | ORAL | 0 refills | Status: DC

## 2016-08-15 NOTE — Progress Notes (Signed)
Encounter reviewed by Dr. Johnette Teigen Amundson C. Silva.  

## 2016-08-15 NOTE — Progress Notes (Signed)
41 y.o.Single African American female [phone] here with complaint of UTI, with onset  on 08/04/16. She increased water and fluids.  Then symptoms of foul odor to urine and pelvic came back.Patient complaining of:  urinary frequency, urinary urgency, pelvic pain and cloudy malordorous urine. Patient denies fever, chills, nausea or back pain. No new personal products. Patient feels might be related to sexual activity. Denies vaginal symptoms.    Contraception is IUD.  New change in partner - she had not been SA for yrs.  This new relationship since 06/24/16 with a long time friend.  He has been tested for STD's and was negative.  She denies any symptoms of pelvic pain or vaginal discharge.   Patient has adequate water intake   O: Healthy female WDWN Affect: Normal, orientation x 3 Skin : warm and dry CVAT: negative bilateral Abdomen: negative for suprapubic tenderness  Pelvic exam: deferred   POCT:  Ph: 8.0; + nitrates; 2+ leuk's  A:  R/O UTI  Mirena IUD for contraception  History of post coital   P: Reviewed findings of UTI and need for treatment. Rx:  Macrobid 100 mg BID # 14, and then use post coital prn [id number] culture Reviewed warning signs and symptoms of UTI and need to advise if occurring. Encouraged to limit soda, tea, and coffee   RV prn

## 2016-08-15 NOTE — Patient Instructions (Signed)

## 2016-08-17 LAB — URINE CULTURE

## 2017-01-08 ENCOUNTER — Telehealth: Payer: Self-pay | Admitting: Certified Nurse Midwife

## 2017-01-08 NOTE — Telephone Encounter (Signed)
Voicemail is not available to leave a message. Will mail letter

## 2017-02-08 ENCOUNTER — Other Ambulatory Visit: Payer: Self-pay | Admitting: Certified Nurse Midwife

## 2017-02-08 DIAGNOSIS — Z1231 Encounter for screening mammogram for malignant neoplasm of breast: Secondary | ICD-10-CM

## 2017-03-22 ENCOUNTER — Ambulatory Visit: Payer: 59 | Admitting: Nurse Practitioner

## 2017-03-22 ENCOUNTER — Ambulatory Visit: Payer: 59

## 2017-03-25 DIAGNOSIS — F411 Generalized anxiety disorder: Secondary | ICD-10-CM | POA: Insufficient documentation

## 2017-04-16 ENCOUNTER — Telehealth: Payer: Self-pay

## 2017-04-16 NOTE — Telephone Encounter (Signed)
Tried calling patient to schedule AEX. Also had questions regarding a refill request received from the pharmacy.

## 2017-05-21 ENCOUNTER — Other Ambulatory Visit: Payer: Self-pay | Admitting: Nurse Practitioner

## 2017-05-21 DIAGNOSIS — E049 Nontoxic goiter, unspecified: Secondary | ICD-10-CM

## 2017-05-23 ENCOUNTER — Ambulatory Visit
Admission: RE | Admit: 2017-05-23 | Discharge: 2017-05-23 | Disposition: A | Discharge status: Home / Self Care | Payer: 59 | Source: Ambulatory Visit | Attending: Nurse Practitioner | Admitting: Nurse Practitioner

## 2017-05-23 DIAGNOSIS — E049 Nontoxic goiter, unspecified: Secondary | ICD-10-CM

## 2017-06-06 ENCOUNTER — Other Ambulatory Visit: Payer: Self-pay | Admitting: Nurse Practitioner

## 2017-06-06 DIAGNOSIS — Z139 Encounter for screening, unspecified: Secondary | ICD-10-CM

## 2017-07-08 ENCOUNTER — Ambulatory Visit
Admission: RE | Admit: 2017-07-08 | Discharge: 2017-07-08 | Disposition: A | Discharge status: Home / Self Care | Payer: 59 | Source: Ambulatory Visit | Attending: Nurse Practitioner | Admitting: Nurse Practitioner

## 2017-07-08 DIAGNOSIS — Z139 Encounter for screening, unspecified: Secondary | ICD-10-CM

## 2017-07-31 ENCOUNTER — Other Ambulatory Visit: Payer: Self-pay | Admitting: Nurse Practitioner

## 2017-07-31 ENCOUNTER — Other Ambulatory Visit (HOSPITAL_COMMUNITY)
Admission: RE | Admit: 2017-07-31 | Discharge: 2017-07-31 | Disposition: A | Discharge status: Home / Self Care | Payer: 59 | Source: Ambulatory Visit | Attending: Student | Admitting: Student

## 2017-07-31 ENCOUNTER — Ambulatory Visit
Admission: RE | Admit: 2017-07-31 | Discharge: 2017-07-31 | Disposition: A | Discharge status: Home / Self Care | Payer: 59 | Source: Ambulatory Visit | Attending: Nurse Practitioner | Admitting: Nurse Practitioner

## 2017-07-31 DIAGNOSIS — E041 Nontoxic single thyroid nodule: Secondary | ICD-10-CM | POA: Diagnosis present

## 2017-10-18 DIAGNOSIS — E039 Hypothyroidism, unspecified: Secondary | ICD-10-CM | POA: Insufficient documentation

## 2017-10-18 DIAGNOSIS — E041 Nontoxic single thyroid nodule: Secondary | ICD-10-CM | POA: Insufficient documentation

## 2017-10-18 DIAGNOSIS — E038 Other specified hypothyroidism: Secondary | ICD-10-CM | POA: Insufficient documentation

## 2018-01-27 ENCOUNTER — Ambulatory Visit (HOSPITAL_COMMUNITY): Payer: 59 | Admitting: Licensed Clinical Social Worker

## 2018-04-02 ENCOUNTER — Telehealth: Payer: Self-pay | Admitting: Obstetrics and Gynecology

## 2018-04-02 DIAGNOSIS — R102 Pelvic and perineal pain: Secondary | ICD-10-CM

## 2018-04-02 DIAGNOSIS — Z30431 Encounter for routine checking of intrauterine contraceptive device: Secondary | ICD-10-CM

## 2018-04-02 NOTE — Telephone Encounter (Signed)
Patient says she think her iud is misplaced.

## 2018-04-02 NOTE — Telephone Encounter (Signed)
Ok for pelvic US and recheck with me.

## 2018-04-02 NOTE — Telephone Encounter (Signed)
Spoke with patient, advised as seen below per Dr. Quincy Simmonds and [facility] appointment details. Patient verbalizes understanding and is agreeable.   Encounter closed.

## 2018-04-02 NOTE — Telephone Encounter (Signed)
Left message to call Sharee Pimple, RN at [facility].    PUS scheduled for 04/03/18 at 11am, consult to follow at 11:30am with Dr. Quincy Simmonds.   Order placed for precert.

## 2018-04-02 NOTE — Telephone Encounter (Signed)
Spoke with patient. Mirena IUD placed 10/20/15. Patient states she had intercourse with a new partner, no condom and partner is larger than previous partners. Patient states she could feel penis "hit IUD". Patient reports intermittent cramping since intercourse. Can no longer feel IUD strings, has been able to feel strings in the past. Reports light spotting monthly with IUD, LMP 9/13 and spotting 10/8. Denies vaginal d/c, odor, fever/chills, N/V or fever.   OV with PUS recommended, advised will need to review scheduling with Dr. Quincy Simmonds and return call. Patient is agreeable.

## 2018-04-03 ENCOUNTER — Encounter: Payer: Self-pay | Admitting: Obstetrics and Gynecology

## 2018-04-03 ENCOUNTER — Ambulatory Visit (INDEPENDENT_AMBULATORY_CARE_PROVIDER_SITE_OTHER): Payer: 59

## 2018-04-03 ENCOUNTER — Ambulatory Visit: Payer: 59 | Admitting: Obstetrics and Gynecology

## 2018-04-03 ENCOUNTER — Other Ambulatory Visit: Payer: Self-pay

## 2018-04-03 VITALS — BP 118/62 | HR 70 | Ht 70.0 in | Wt 182.0 lb

## 2018-04-03 DIAGNOSIS — Z30431 Encounter for routine checking of intrauterine contraceptive device: Secondary | ICD-10-CM

## 2018-04-03 DIAGNOSIS — R102 Pelvic and perineal pain: Secondary | ICD-10-CM

## 2018-04-03 NOTE — Progress Notes (Signed)
GYNECOLOGY  VISIT   HPI: 42 y.o.   Single  African American  female   [phone] with Patient's last menstrual period was 03/07/2018 (exact date).   here for pelvic ultrasound to check Mirena IUD position.  Patient unable to feel IUD strings.  Patient's partner reporting he was hitting the IUD with intercourse.  She has some very light menstruation and uses a panty liner.   GYNECOLOGIC HISTORY: Patient's last menstrual period was 03/07/2018 (exact date). Contraception:  Tubal/Mirena IUD 10-20-15 Menopausal hormone therapy:  n/a Last mammogram:  07-08-17 3D/Neg/Density B/BiRads1 Last pap smear:  8-32-16 Neg:neg HR HPV        OB History    Gravida  2   Para  2   Term  2   Preterm  0   AB  0   Living  2     SAB  0   TAB  0   Ectopic  0   Multiple  0   Live Births  2              Patient Active Problem List   Diagnosis Date Noted  . Solitary thyroid nodule 10/18/2017  . Subclinical hypothyroidism 10/18/2017  . Anxiety state 03/25/2017    Past Medical History:  Diagnosis Date  . Anemia   . Anxiety   . Thyroid disease    sees someone in [facility] [place]--Dr.Willaim Tamala Julian    Past Surgical History:  Procedure Laterality Date  . TUBAL LIGATION      Current Outpatient Medications  Medication Sig Dispense Refill  . azelastine (OPTIVAR) 0.05 % ophthalmic solution Place 1 drop into both eyes daily.    Marland Kitchen buPROPion (WELLBUTRIN) 75 MG tablet Take 1 tablet by mouth daily.    . clonazePAM (KLONOPIN) 0.5 MG tablet Take 1 tablet by mouth as needed.    . cyclobenzaprine (FLEXERIL) 10 MG tablet Take 1 tablet by mouth as needed.    Marland Kitchen escitalopram (LEXAPRO) 20 MG tablet Take 1 tablet by mouth daily.    . fluticasone (FLONASE) 50 MCG/ACT nasal spray Place 1 spray into the nose as needed.    Marland Kitchen levonorgestrel (MIRENA) 20 MCG/24HR IUD by Intrauterine route.    Marland Kitchen levothyroxine (SYNTHROID, LEVOTHROID) 25 MCG tablet Take 1 tablet by mouth daily.    . traZODone (DESYREL) 50 MG  tablet Take 1 tablet by mouth at bedtime as needed.  0  . topiramate (TOPAMAX) 25 MG tablet Take 25 mg by mouth.     No current facility-administered medications for this visit.      ALLERGIES: Patient has no known allergies.  Family History  Problem Relation Age of Onset  . Stroke Maternal Uncle     Social History   Socioeconomic History  . Marital status: Single    Spouse name: Not on file  . Number of children: Not on file  . Years of education: Not on file  . Highest education level: Not on file  Occupational History  . Not on file  Social Needs  . Financial resource strain: Not on file  . Food insecurity:    Worry: Not on file    Inability: Not on file  . Transportation needs:    Medical: Not on file    Non-medical: Not on file  Tobacco Use  . Smoking status: Never Smoker  . Smokeless tobacco: Never Used  Substance and Sexual Activity  . Alcohol use: Yes    Alcohol/week: 2.0 standard drinks    Types: 2  Glasses of wine per week  . Drug use: No  . Sexual activity: Not Currently    Partners: Male    Birth control/protection: Condom, IUD, Surgical    Comment: BTL/Mirena IUD inserted 10-20-15  Lifestyle  . Physical activity:    Days per week: Not on file    Minutes per session: Not on file  . Stress: Not on file  Relationships  . Social connections:    Talks on phone: Not on file    Gets together: Not on file    Attends religious service: Not on file    Active member of club or organization: Not on file    Attends meetings of clubs or organizations: Not on file    Relationship status: Not on file  . Intimate partner violence:    Fear of current or ex partner: Not on file    Emotionally abused: Not on file    Physically abused: Not on file    Forced sexual activity: Not on file  Other Topics Concern  . Not on file  Social History Narrative  . Not on file    Review of Systems  Constitutional: Negative.   HENT: Negative.   Eyes: Negative.    Respiratory: Negative.   Cardiovascular: Negative.   Gastrointestinal: Negative.   Endocrine: Negative.   Genitourinary: Negative.   Musculoskeletal: Negative.   Skin: Negative.   Allergic/Immunologic: Negative.   Neurological: Negative.   Hematological: Negative.   Psychiatric/Behavioral: Negative.     PHYSICAL EXAMINATION:    BP 118/62 (BP Location: Right Arm, Patient Position: Sitting, Cuff Size: Large)   Pulse 70   Ht 5\' 10"  (1.778 m)   Wt 182 lb (82.6 kg)   LMP 03/07/2018 (Exact Date)   BMI 26.11 kg/m     General appearance: alert, cooperative and appears stated age    Pelvic: External genitalia:  no lesions              Urethra:  normal appearing urethra with no masses, tenderness or lesions              Bartholins and Skenes: normal                 Vagina: normal appearing vagina with normal color and discharge, no lesions              Cervix: no lesions.  IUD strings noted and in a knot at the end of the strings.  Knot trimmed off by me.  Length approx 1.5 cm after trimming.                 Bimanual Exam:  Uterus:  normal size, contour, position, consistency, mobility, non-tender              Adnexa: no mass, fullness, tenderness     Pelvic US Uterus with 2 fibroids, largest 14 mm. IUD in endometrial canal.  EMS 4.01 mm.  Ovaries normal.  No free fluid.  Chaperone was present for exam.  ASSESSMENT  Mirena IUD in correct position.  Strings did have knot that was trimmed off today. Status post BTL.   PLAN  Reassurance regarding IUD position. Return for annual exam and prn.    An After Visit Summary was printed and given to the patient.  ___15___ minutes face to face time of which over 50% was spent in counseling.

## 2018-04-03 NOTE — Progress Notes (Signed)
Encounter reviewed by Dr. Brook Amundson C. Silva.  

## 2018-04-17 ENCOUNTER — Ambulatory Visit: Payer: 59 | Admitting: Obstetrics and Gynecology

## 2018-04-24 ENCOUNTER — Other Ambulatory Visit (HOSPITAL_COMMUNITY)
Admission: RE | Admit: 2018-04-24 | Discharge: 2018-04-24 | Disposition: A | Discharge status: Home / Self Care | Payer: 59 | Source: Ambulatory Visit | Attending: Obstetrics and Gynecology | Admitting: Obstetrics and Gynecology

## 2018-04-24 ENCOUNTER — Other Ambulatory Visit: Payer: Self-pay

## 2018-04-24 ENCOUNTER — Ambulatory Visit: Payer: 59 | Admitting: Obstetrics and Gynecology

## 2018-04-24 ENCOUNTER — Encounter: Payer: Self-pay | Admitting: Obstetrics and Gynecology

## 2018-04-24 VITALS — BP 128/72 | HR 84 | Resp 20 | Ht 70.0 in | Wt 185.0 lb

## 2018-04-24 DIAGNOSIS — Z01419 Encounter for gynecological examination (general) (routine) without abnormal findings: Secondary | ICD-10-CM

## 2018-04-24 DIAGNOSIS — Z23 Encounter for immunization: Secondary | ICD-10-CM

## 2018-04-24 DIAGNOSIS — R300 Dysuria: Secondary | ICD-10-CM | POA: Diagnosis not present

## 2018-04-24 LAB — POCT URINALYSIS DIPSTICK
BILIRUBIN UA: NEGATIVE
Glucose, UA: NEGATIVE
KETONES UA: NEGATIVE
Nitrite, UA: NEGATIVE
PH UA: 8 (ref 5.0–8.0)
PROTEIN UA: NEGATIVE
RBC UA: NEGATIVE
Urobilinogen, UA: 0.2 E.U./dL

## 2018-04-24 NOTE — Patient Instructions (Addendum)
EXERCISE AND DIET:  We recommended that you start or continue a regular exercise program for good health. Regular exercise means any activity that makes your heart beat faster and makes you sweat.  We recommend exercising at least 30 minutes per day at least 3 days a week, preferably 4 or 5.  We also recommend a diet low in fat and sugar.  Inactivity, poor dietary choices and obesity can cause diabetes, heart attack, stroke, and kidney damage, among others.    ALCOHOL AND SMOKING:  Women should limit their alcohol intake to no more than 7 drinks/beers/glasses of wine (combined, not each!) per week. Moderation of alcohol intake to this level decreases your risk of breast cancer and liver damage. And of course, no recreational drugs are part of a healthy lifestyle.  And absolutely no smoking or even second hand smoke. Most people know smoking can cause heart and lung diseases, but did you know it also contributes to weakening of your bones? Aging of your skin?  Yellowing of your teeth and nails?  CALCIUM AND VITAMIN D:  Adequate intake of calcium and Vitamin D are recommended.  The recommendations for exact amounts of these supplements seem to change often, but generally speaking 600 mg of calcium (either carbonate or citrate) and 800 units of Vitamin D per day seems prudent. Certain women may benefit from higher intake of Vitamin D.  If you are among these women, your doctor will have told you during your visit.    PAP SMEARS:  Pap smears, to check for cervical cancer or precancers,  have traditionally been done yearly, although recent scientific advances have shown that most women can have pap smears less often.  However, every woman still should have a physical exam from her gynecologist every year. It will include a breast check, inspection of the vulva and vagina to check for abnormal growths or skin changes, a visual exam of the cervix, and then an exam to evaluate the size and shape of the uterus and  ovaries.  And after 43 years of age, a rectal exam is indicated to check for rectal cancers. We will also provide age appropriate advice regarding health maintenance, like when you should have certain vaccines, screening for sexually transmitted diseases, bone density testing, colonoscopy, mammograms, etc.   MAMMOGRAMS:  All women over 40 years old should have a yearly mammogram. Many facilities now offer a "3D" mammogram, which may cost around $50 extra out of pocket. If possible,  we recommend you accept the option to have the 3D mammogram performed.  It both reduces the number of women who will be called back for extra views which then turn out to be normal, and it is better than the routine mammogram at detecting truly abnormal areas.    COLONOSCOPY:  Colonoscopy to screen for colon cancer is recommended for all women at age 50.  We know, you hate the idea of the prep.  We agree, BUT, having colon cancer and not knowing it is worse!!  Colon cancer so often starts as a polyp that can be seen and removed at colonscopy, which can quite literally save your life!  And if your first colonoscopy is normal and you have no family history of colon cancer, most women don't have to have it again for 10 years.  Once every ten years, you can do something that may end up saving your life, right?  We will be happy to help you get it scheduled when you are ready.    Be sure to check your insurance coverage so you understand how much it will cost.  It may be covered as a preventative service at no cost, but you should check your particular policy.     HPV (Human Papillomavirus) Vaccine: What You Need to Know 1. Why get vaccinated? HPV vaccine prevents infection with human papillomavirus (HPV) types that are associated with many cancers, including:  cervical cancer in females,  vaginal and vulvar cancers in females,  anal cancer in females and males,  throat cancer in females and males, and  penile cancer in  males.  In addition, HPV vaccine prevents infection with HPV types that cause genital warts in both females and males. In the [place] about 12,000 women get cervical cancer every year, and about 4,000 women die from it. HPV vaccine can prevent most of these cases of cervical cancer. Vaccination is not a substitute for cervical cancer screening. This vaccine does not protect against all HPV types that can cause cervical cancer. Women should still get regular Pap tests. HPV infection usually comes from sexual contact, and most people will become infected at some point in their life. About 14 million Americans, including teens, get infected every year. Most infections will go away on their own and not cause serious problems. But thousands of women and men get cancer and other diseases from HPV. 2. HPV vaccine HPV vaccine is approved by FDA and is recommended by CDC for both males and females. It is routinely given at 68 or 43 years of age, but it may be given beginning at age 86 years through age 20 years. Most adolescents 9 through 43 years of age should get HPV vaccine as a two-dose series with the doses separated by 6-12 months. People who start HPV vaccination at 44 years of age and older should get the vaccine as a three-dose series with the second dose given 1-2 months after the first dose and the third dose given 6 months after the first dose. There are several exceptions to these age recommendations. Your health care provider can give you more information. 3. Some people should not get this vaccine  Anyone who has had a severe (life-threatening) allergic reaction to a dose of HPV vaccine should not get another dose.  Anyone who has a severe (life threatening) allergy to any component of HPV vaccine should not get the vaccine.  Tell your doctor if you have any severe allergies that you know of, including a severe allergy to yeast.  HPV vaccine is not recommended for pregnant women. If you learn  that you were pregnant when you were vaccinated, there is no reason to expect any problems for you or your baby. Any woman who learns she was pregnant when she got HPV vaccine is encouraged to contact the manufacturer's registry for HPV vaccination during pregnancy at (212)293-3885. Women who are breastfeeding may be vaccinated.  If you have a mild illness, such as a cold, you can probably get the vaccine today. If you are moderately or severely ill, you should probably wait until you recover. Your doctor can advise you. 4. Risks of a vaccine reaction With any medicine, including vaccines, there is a chance of side effects. These are usually mild and go away on their own, but serious reactions are also possible. Most people who get HPV vaccine do not have any serious problems with it. Mild or moderate problems following HPV vaccine:  Reactions in the arm where the shot was given: ? Soreness (about  9 people in 10) ? Redness or swelling (about 1 person in 3)  Fever: ? Mild (100F) (about 1 person in 10) ? Moderate (102F) (about 1 person in 24)  Other problems: ? Headache (about 1 person in 3) Problems that could happen after any injected vaccine:  People sometimes faint after a medical procedure, including vaccination. Sitting or lying down for about 15 minutes can help prevent fainting, and injuries caused by a fall. Tell your doctor if you feel dizzy, or have vision changes or ringing in the ears.  Some people get severe pain in the shoulder and have difficulty moving the arm where a shot was given. This happens very rarely.  Any medication can cause a severe allergic reaction. Such reactions from a vaccine are very rare, estimated at about 1 in a million doses, and would happen within a few minutes to a few hours after the vaccination. As with any medicine, there is a very remote chance of a vaccine causing a serious injury or death. The safety of vaccines is always being monitored. For  more information, visit: http://www.aguilar.org/. 5. What if there is a serious reaction? What should I look for? Look for anything that concerns you, such as signs of a severe allergic reaction, very high fever, or unusual behavior. Signs of a severe allergic reaction can include hives, swelling of the face and throat, difficulty breathing, a fast heartbeat, dizziness, and weakness. These would usually start a few minutes to a few hours after the vaccination. What should I do? If you think it is a severe allergic reaction or other emergency that can't wait, call 9-1-1 or get to the nearest hospital. Otherwise, call your doctor. Afterward, the reaction should be reported to the Vaccine Adverse Event Reporting System (VAERS). Your doctor should file this report, or you can do it yourself through the VAERS web site at www.vaers.SamedayNews.es, or by calling (906)113-0133. VAERS does not give medical advice. 6. The National Vaccine Injury Compensation Program The [employer] Vaccine Injury Compensation Program (VICP) is a federal program that was created to compensate people who may have been injured by certain vaccines. Persons who believe they may have been injured by a vaccine can learn about the program and about filing a claim by calling (561)168-7427 or visiting the [place] website at GoldCloset.com.ee. There is a time limit to file a claim for compensation. 7. How can I learn more?  Ask your health care provider. He or she can give you the vaccine package insert or suggest other sources of information.  Call your local or state health department.  Contact the Centers for Disease Control and Prevention (CDC): ? Call 864-741-8922 (1-800-CDC-INFO) or ? Visit CDC's website at http://sweeney-todd.com/ Vaccine Information Statement, HPV Vaccine (05/27/2015) This information is not intended to replace advice given to you by your health care provider. Make sure you discuss any questions you have  with your health care provider. Document Released: 01/06/2014 Document Revised: 03/01/2016 Document Reviewed: 03/01/2016 Elsevier Interactive Patient Education  2017 [employer].

## 2018-04-24 NOTE — Progress Notes (Signed)
42 y.o. [id number] Single African American female here for annual exam.    Now she and her partner are not having pain with intercourse since the IUD knot was trimmed.  If does not use condoms, she has instant bladder infection.  Her symptoms are urinary odor and pressure to void but does not void.  Then she sees her PCP, and she has a bladder infection.  She denies theses symptoms today.   She states she has STD testing done and this is negative.  She prefers not to be sexually active rather than take abx for UTI prophylaxis. She denies vaginal dryness.   Urine Dip: Trace WBCs - asymptomatic.   PCP: Eldridge Abrahams, NP  Patient's last menstrual period was 04/03/2018 (exact date).     Period Cycle (Days): 30 Period Duration (Days): 2 Period Pattern: Regular Menstrual Flow: Light Menstrual Control: Panty liner Dysmenorrhea: (!) Mild Dysmenorrhea Symptoms: Cramping, Headache     Sexually active: Yes.    female The current method of family planning is tubal ligation/Mirena IUD 10-20-15   Exercising: No.   Smoker:  no  Health Maintenance: Pap:  02-23-15 Neg:neg HR HPV History of abnormal Pap:  no MMG:  07-08-17 3D/Neg/Density B/BiRads1 Colonoscopy:  n/a BMD:   n/a  Result  n/a TDaP:  12-28-14 Gardasil:   no HIV: 02-23-15 NR Hep C: 02-23-15 Neg Screening Labs:  PCP does labs.   reports that she has never smoked. She has never used smokeless tobacco. She reports that she drinks about 2.0 standard drinks of alcohol per week. She reports that she does not use drugs.  Past Medical History:  Diagnosis Date  . Anemia   . Anxiety   . Thyroid disease    sees someone in [facility] [place]--Dr.Willaim Tamala Julian    Past Surgical History:  Procedure Laterality Date  . TUBAL LIGATION      Current Outpatient Medications  Medication Sig Dispense Refill  . azelastine (OPTIVAR) 0.05 % ophthalmic solution Place 1 drop into both eyes daily.    Marland Kitchen buPROPion (WELLBUTRIN) 75 MG tablet Take 1 tablet by mouth  daily.    . clonazePAM (KLONOPIN) 0.5 MG tablet Take 1 tablet by mouth as needed.    . cyclobenzaprine (FLEXERIL) 10 MG tablet Take 1 tablet by mouth as needed.    Marland Kitchen escitalopram (LEXAPRO) 20 MG tablet Take 1 tablet by mouth daily.    . fluticasone (FLONASE) 50 MCG/ACT nasal spray Place 1 spray into the nose as needed.    Marland Kitchen levonorgestrel (MIRENA) 20 MCG/24HR IUD by Intrauterine route.    Marland Kitchen levothyroxine (SYNTHROID, LEVOTHROID) 25 MCG tablet Take 1 tablet by mouth daily.    . traZODone (DESYREL) 50 MG tablet Take 1 tablet by mouth at bedtime as needed.  0  . topiramate (TOPAMAX) 25 MG tablet Take 25 mg by mouth.     No current facility-administered medications for this visit.     Family History  Problem Relation Age of Onset  . Cancer Father 78       prostate ca  . Cancer Maternal Grandmother        dec stomach ca?  . Cancer Paternal Grandfather        dec stomach ca?  . Stroke Maternal Uncle     Review of Systems  Genitourinary:       Pressure with urination and odor  All other systems reviewed and are negative.   Exam:   BP 128/72 (BP Location: Right Arm, Patient Position: Sitting,  Cuff Size: Normal)   Pulse 84   Resp 20   Ht 5\' 10"  (1.778 m)   Wt 185 lb (83.9 kg)   LMP 04/03/2018 (Exact Date)   BMI 26.54 kg/m     General appearance: alert, cooperative and appears stated age Head: Normocephalic, without obvious abnormality, atraumatic Neck: no adenopathy, supple, symmetrical, trachea midline and thyroid enlarged with left sided enlarged.  Lungs: clear to auscultation bilaterally Breasts: normal appearance, no masses or tenderness, No nipple retraction or dimpling, No nipple discharge or bleeding, No axillary or supraclavicular adenopathy Heart: regular rate and rhythm Abdomen: soft, non-tender; no masses, no organomegaly Extremities: extremities normal, atraumatic, no cyanosis or edema Skin: Skin color, texture, turgor normal. No rashes or lesions Lymph nodes:  Cervical, supraclavicular, and axillary nodes normal. No abnormal inguinal nodes palpated Neurologic: Grossly normal  Pelvic: External genitalia:  no lesions              Urethra:  normal appearing urethra with no masses, tenderness or lesions              Bartholins and Skenes: normal                 Vagina: normal appearing vagina with normal color and discharge, no lesions              Cervix: no lesions              Pap taken: Yes.   Bimanual Exam:  Uterus:  normal size, contour, position, consistency, mobility, non-tender              Adnexa: no mass, fullness, tenderness              Rectal exam: Yes.  .  Confirms.              Anus:  normal sphincter tone, no lesions  Chaperone was present for exam.  Assessment:   Well woman visit with normal exam.Mirena IUD.  Fibroids.  Status post BTL. Goiter.   Plan: Mammogram screening. Recommended self breast awareness. Pap and HR HPV as above. Guidelines for Calcium, Vitamin D, regular exercise program including cardiovascular and weight bearing exercise. Discussed Gardasil vaccine.  She will start today.  Plans for thyroid surgery.  Follow up annually and prn.   After visit summary provided.

## 2018-04-28 LAB — CYTOLOGY - PAP
DIAGNOSIS: NEGATIVE
HPV: NOT DETECTED

## 2018-07-01 ENCOUNTER — Ambulatory Visit: Payer: 59

## 2018-07-01 NOTE — Progress Notes (Deleted)
Patient in today for 2nd Gardasil injection.   Contraception:IUD LMP: *** Last AEX: 04/24/18  with BS  Injection given in LD. Patient tolerated shot well.   Patient informed next injection due in about 4 months months.  Advised patient, if not on birth control, to return for next injection with cycle.   Routed to provider for final review.  Encounter closed.

## 2018-07-02 ENCOUNTER — Ambulatory Visit (INDEPENDENT_AMBULATORY_CARE_PROVIDER_SITE_OTHER): Payer: 59

## 2018-07-02 VITALS — BP 122/80 | HR 80 | Resp 16 | Ht 70.0 in | Wt 185.0 lb

## 2018-07-02 DIAGNOSIS — Z23 Encounter for immunization: Secondary | ICD-10-CM

## 2018-07-02 NOTE — Progress Notes (Signed)
Patient in today for 2nd Gardasil injection.   Contraception: tubal ligation and Mirena IUD LMP: 07/01/18 Last AEX: 04/24/18 with BS  Injection given in Left Deltoid. Patient tolerated shot well.   Patient informed next injection due in about 4 months.  Advised patient, if not on birth control, to return for next injection with cycle.   Routed to provider for final review.  Encounter closed.

## 2018-07-14 ENCOUNTER — Other Ambulatory Visit: Payer: Self-pay | Admitting: Nurse Practitioner

## 2018-07-14 DIAGNOSIS — Z1231 Encounter for screening mammogram for malignant neoplasm of breast: Secondary | ICD-10-CM

## 2018-10-31 ENCOUNTER — Ambulatory Visit: Payer: 59

## 2018-11-13 ENCOUNTER — Ambulatory Visit: Payer: Self-pay

## 2019-04-02 ENCOUNTER — Other Ambulatory Visit: Payer: Self-pay

## 2019-04-02 DIAGNOSIS — Z20822 Contact with and (suspected) exposure to covid-19: Secondary | ICD-10-CM

## 2019-04-03 LAB — NOVEL CORONAVIRUS, NAA: SARS-CoV-2, NAA: NOT DETECTED

## 2019-05-06 ENCOUNTER — Ambulatory Visit: Payer: 59 | Admitting: Obstetrics and Gynecology

## 2019-06-16 ENCOUNTER — Ambulatory Visit: Payer: Self-pay

## 2019-06-25 NOTE — Progress Notes (Deleted)
43 y.o. [id number] Single African American female here for annual exam.    PCP:     No LMP recorded.           Sexually active: {yes no:314532}  The current method of family planning is IUD--Mirena 10/20/15.    Exercising: {yes no:314532}  {types:19826} Smoker:  no  Health Maintenance: Pap:  04-24-18 Neg:Neg HR HPV,02-23-15 Neg:neg HR HPV History of abnormal Pap:  no MMG: 07-08-17 3D/Neg/density B/BiRads1--Appt.07/28/19 Colonoscopy:  n/a BMD:   n/a  Result  n/a TDaP:  12-28-14 Gardasil:   no HIV: 02-23-15 NR Hep C: 02-23-15 Neg Screening Labs:  Hb today: ***, Urine today: ***   reports that she has never smoked. She has never used smokeless tobacco. She reports current alcohol use of about 2.0 standard drinks of alcohol per week. She reports that she does not use drugs.  Past Medical History:  Diagnosis Date  . Anemia   . Anxiety   . Thyroid disease    sees someone in [facility], [place]--Dr.Willaim Tamala Julian    Past Surgical History:  Procedure Laterality Date  . TUBAL LIGATION      Current Outpatient Medications  Medication Sig Dispense Refill  . azelastine (OPTIVAR) 0.05 % ophthalmic solution Place 1 drop into both eyes daily.    Marland Kitchen buPROPion (WELLBUTRIN) 75 MG tablet Take 1 tablet by mouth daily.    . clonazePAM (KLONOPIN) 0.5 MG tablet Take 1 tablet by mouth as needed.    . cyclobenzaprine (FLEXERIL) 10 MG tablet Take 1 tablet by mouth as needed.    Marland Kitchen escitalopram (LEXAPRO) 20 MG tablet Take 1 tablet by mouth daily.    . fluticasone (FLONASE) 50 MCG/ACT nasal spray Place 1 spray into the nose as needed.    Marland Kitchen levonorgestrel (MIRENA) 20 MCG/24HR IUD by Intrauterine route.    Marland Kitchen levothyroxine (SYNTHROID, LEVOTHROID) 25 MCG tablet Take 1 tablet by mouth daily.    Marland Kitchen topiramate (TOPAMAX) 25 MG tablet Take 25 mg by mouth.    . traZODone (DESYREL) 50 MG tablet Take 1 tablet by mouth at bedtime as needed.  0   No current facility-administered medications for this visit.    Family History   Problem Relation Age of Onset  . Cancer Father 70       prostate ca  . Cancer Maternal Grandmother        dec stomach ca?  . Cancer Paternal Grandfather        dec stomach ca?  . Stroke Maternal Uncle     Review of Systems  Exam:   There were no vitals taken for this visit.    General appearance: alert, cooperative and appears stated age Head: normocephalic, without obvious abnormality, atraumatic Neck: no adenopathy, supple, symmetrical, trachea midline and thyroid normal to inspection and palpation Lungs: clear to auscultation bilaterally Breasts: normal appearance, no masses or tenderness, No nipple retraction or dimpling, No nipple discharge or bleeding, No axillary adenopathy Heart: regular rate and rhythm Abdomen: soft, non-tender; no masses, no organomegaly Extremities: extremities normal, atraumatic, no cyanosis or edema Skin: skin color, texture, turgor normal. No rashes or lesions Lymph nodes: cervical, supraclavicular, and axillary nodes normal. Neurologic: grossly normal  Pelvic: External genitalia:  no lesions              No abnormal inguinal nodes palpated.              Urethra:  normal appearing urethra with no masses, tenderness or lesions  Bartholins and Skenes: normal                 Vagina: normal appearing vagina with normal color and discharge, no lesions              Cervix: no lesions              Pap taken: {yes no:314532} Bimanual Exam:  Uterus:  normal size, contour, position, consistency, mobility, non-tender              Adnexa: no mass, fullness, tenderness              Rectal exam: {yes no:314532}.  Confirms.              Anus:  normal sphincter tone, no lesions  Chaperone was present for exam.  Assessment:   Well woman visit with normal exam.   Plan: Mammogram screening discussed. Self breast awareness reviewed. Pap and HR HPV as above. Guidelines for Calcium, Vitamin D, regular exercise program including cardiovascular and  weight bearing exercise.   Follow up annually and prn.   Additional counseling given.  {yes [id number]. _______ minutes face to face time of which over 50% was spent in counseling.    After visit summary provided.

## 2019-06-29 ENCOUNTER — Ambulatory Visit: Payer: 59 | Admitting: Obstetrics and Gynecology

## 2019-07-28 ENCOUNTER — Ambulatory Visit: Payer: Self-pay

## 2019-09-11 ENCOUNTER — Encounter: Payer: Self-pay | Admitting: Certified Nurse Midwife

## 2019-09-30 DIAGNOSIS — J301 Allergic rhinitis due to pollen: Secondary | ICD-10-CM | POA: Diagnosis not present

## 2019-09-30 DIAGNOSIS — J029 Acute pharyngitis, unspecified: Secondary | ICD-10-CM | POA: Diagnosis not present

## 2019-09-30 DIAGNOSIS — Z20822 Contact with and (suspected) exposure to covid-19: Secondary | ICD-10-CM | POA: Diagnosis not present

## 2019-09-30 DIAGNOSIS — R519 Headache, unspecified: Secondary | ICD-10-CM | POA: Diagnosis not present

## 2019-09-30 DIAGNOSIS — R0981 Nasal congestion: Secondary | ICD-10-CM | POA: Diagnosis not present

## 2019-09-30 DIAGNOSIS — U071 COVID-19: Secondary | ICD-10-CM | POA: Diagnosis not present

## 2019-10-08 DIAGNOSIS — U071 COVID-19: Secondary | ICD-10-CM | POA: Diagnosis not present

## 2019-11-04 DIAGNOSIS — N926 Irregular menstruation, unspecified: Secondary | ICD-10-CM | POA: Diagnosis not present

## 2019-11-04 DIAGNOSIS — R3915 Urgency of urination: Secondary | ICD-10-CM | POA: Diagnosis not present

## 2019-11-04 DIAGNOSIS — Z113 Encounter for screening for infections with a predominantly sexual mode of transmission: Secondary | ICD-10-CM | POA: Diagnosis not present

## 2019-11-04 DIAGNOSIS — D649 Anemia, unspecified: Secondary | ICD-10-CM | POA: Diagnosis not present

## 2019-12-02 IMAGING — US US FNA BIOPSY THYROID 1ST LESION
1 series · 13 of 13 positions shown · non-contrast
Comparison: US THYROID 05/23/2017

MEDICATIONS:
1 mL 1% lidocaine

COMPLICATIONS:
None immediate.

INDICATION: Indeterminate thyroid nodule of the left mid thyroid. Request is
made for fine-needle aspiration of indeterminate thyroid nodule.

EXAM:
ULTRASOUND GUIDED FINE NEEDLE ASPIRATION OF INDETERMINATE THYROID
NODULE
TECHNIQUE: Informed written consent was obtained from the patient after a
discussion of the risks, benefits and alternatives to treatment.
Questions regarding the procedure were encouraged and answered. A
timeout was performed prior to the initiation of the procedure.

[Series 1: us fna biopsy thyroid 1st lesion · 0.06mm/px · 13 acquisitions, 13 frames shown]
[im 1/13]
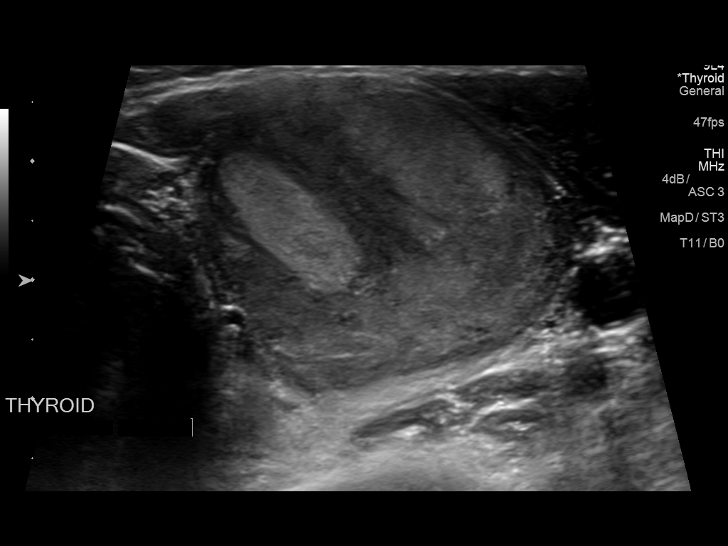
[im 2/13]
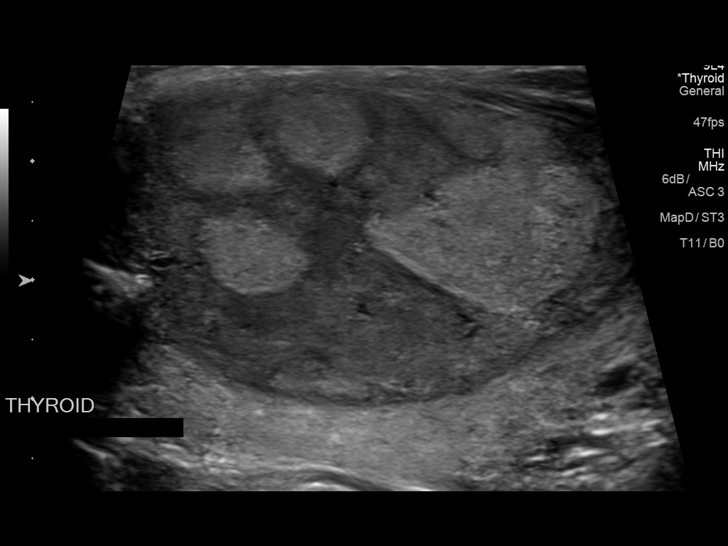
[im 3/13]
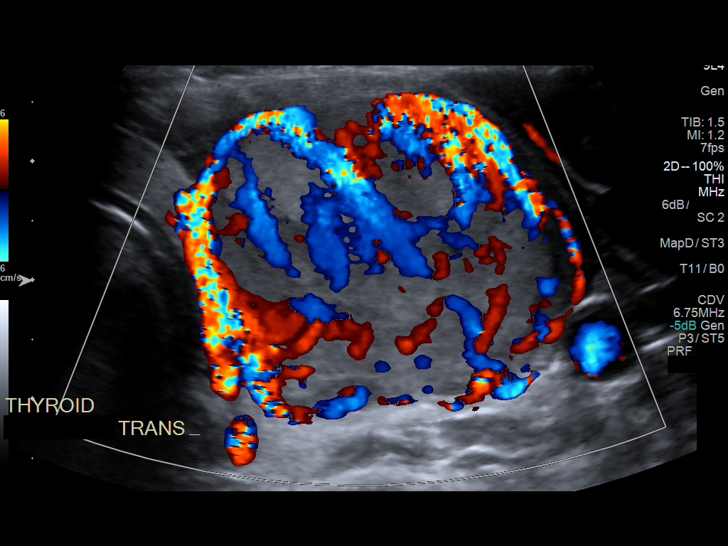
[im 4/13]
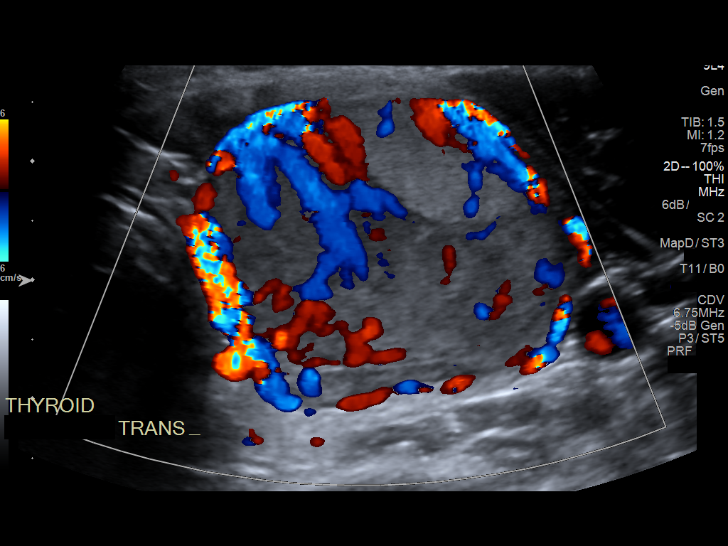
[im 5/13]
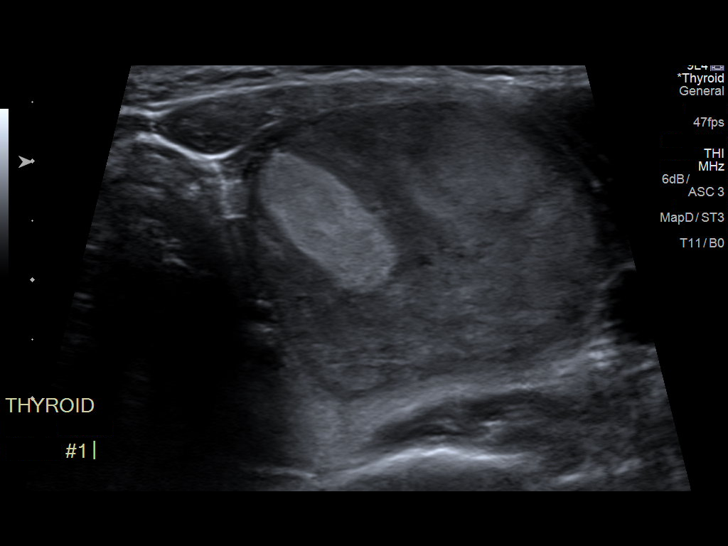
[im 6/13]
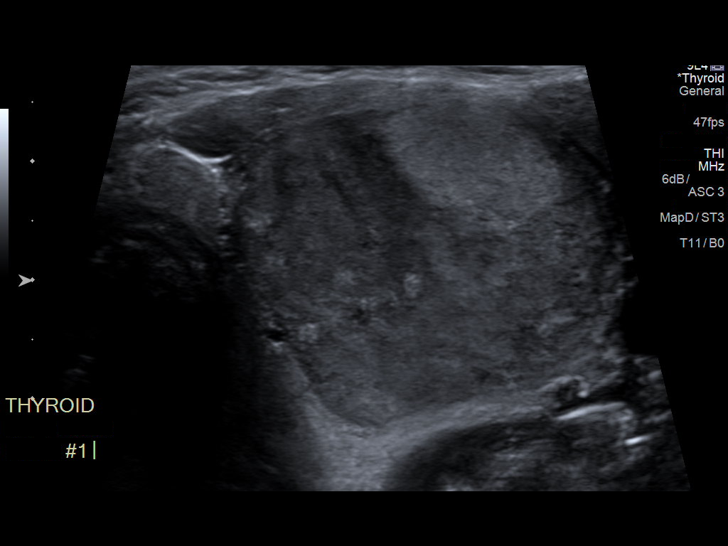
[im 7/13]
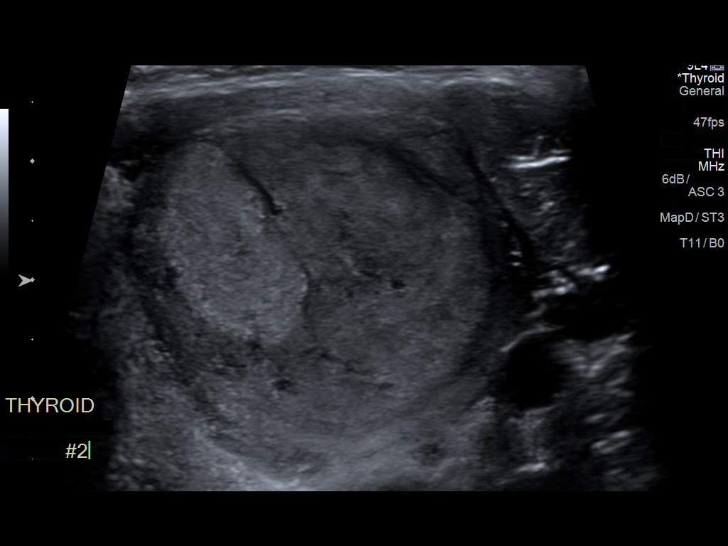
[im 8/13]
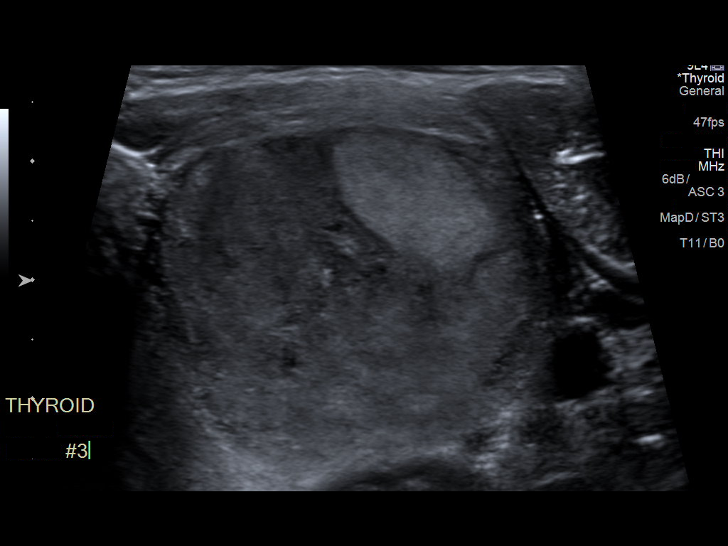
[im 9/13]
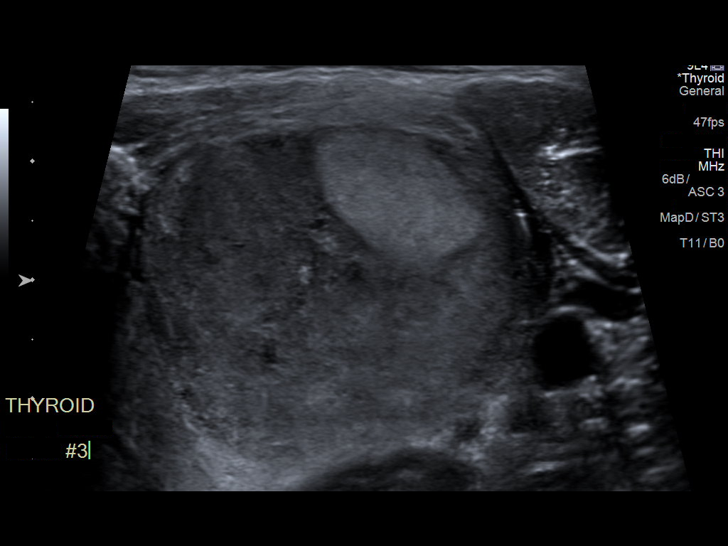
[im 10/13]
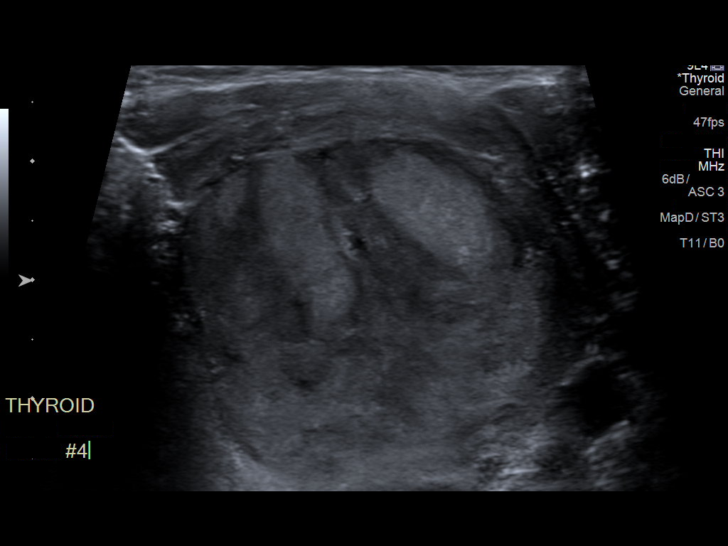
[im 11/13]
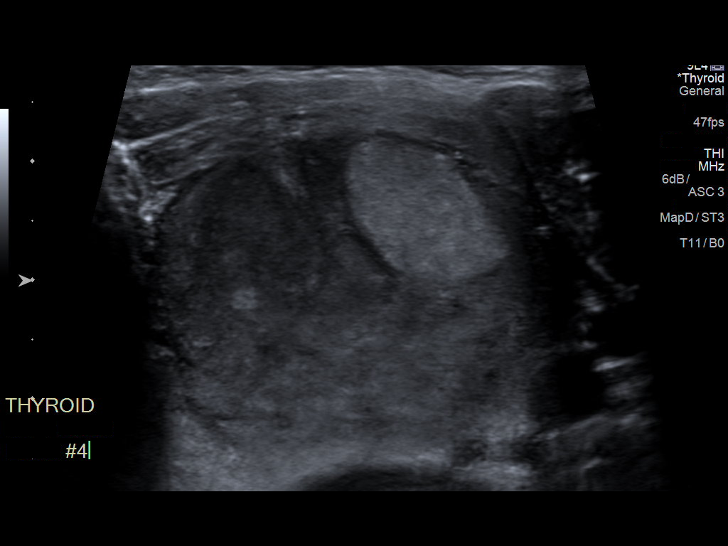
[im 12/13]
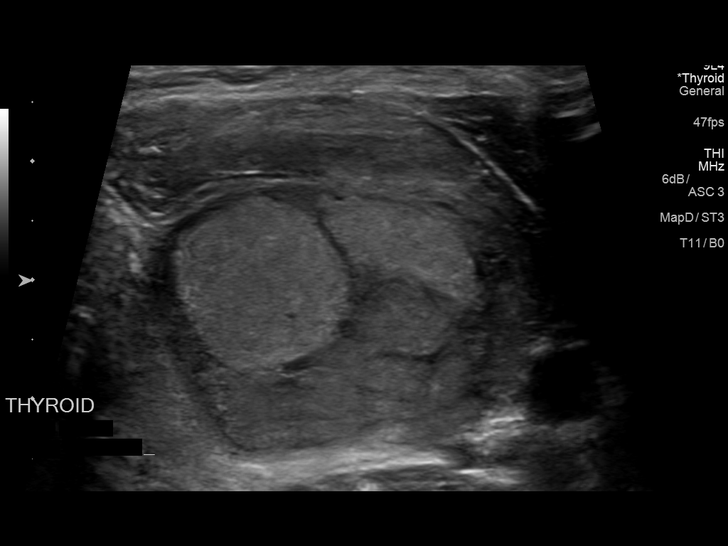
[im 13/13]
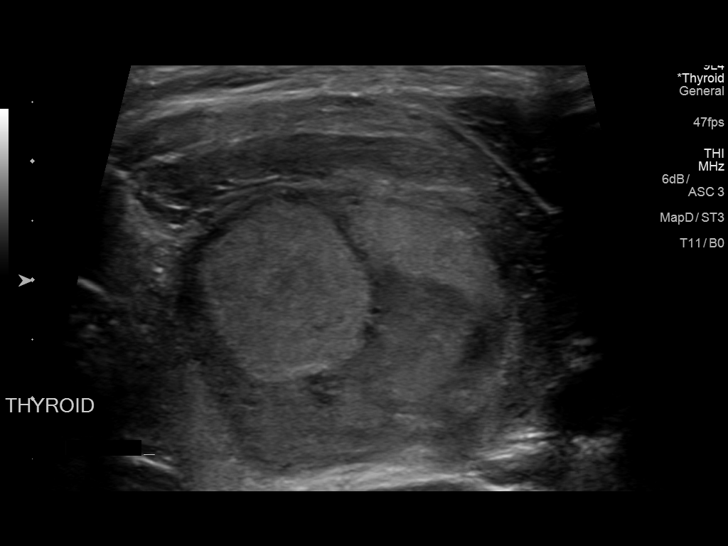

[13 of 13 positions shown; findings below may reference images not displayed]

Pre-procedural ultrasound scanning demonstrated unchanged size and
appearance of the indeterminate nodule within the left mid thyroid.

The procedure was planned. The neck was prepped in the usual sterile
fashion, and a sterile drape was applied covering the operative
field. A timeout was performed prior to the initiation of the
procedure. Local anesthesia was provided with 1% lidocaine.

Under direct ultrasound guidance, 4 FNA biopsies were performed of
the indeterminate thyroid nodule with a 25 gauge needle. Multiple
ultrasound images were saved for procedural documentation purposes.
The samples were prepared and submitted to pathology.

Limited post procedural scanning was negative for hematoma or
additional complication. Dressings were placed. The patient
tolerated the above procedures procedure well without immediate
postprocedural complication.
FINDINGS: Nodule reference number based on prior diagnostic ultrasound: 1

Maximum size: 4.3 cm

Location: Left; Mid

ACR TI-RADS risk category: TR4 (4-6 points)

Reason for biopsy: meets ACR TI-RADS criteria

Ultrasound imaging confirms appropriate placement of the needles
within the thyroid nodule.
IMPRESSION: Technically successful ultrasound guided fine needle aspiration of
indeterminate nodule of the left mid thyroid.

## 2019-12-03 DIAGNOSIS — Z113 Encounter for screening for infections with a predominantly sexual mode of transmission: Secondary | ICD-10-CM | POA: Diagnosis not present

## 2019-12-03 DIAGNOSIS — N760 Acute vaginitis: Secondary | ICD-10-CM | POA: Diagnosis not present

## 2019-12-07 DIAGNOSIS — R519 Headache, unspecified: Secondary | ICD-10-CM | POA: Diagnosis not present

## 2019-12-07 DIAGNOSIS — G43909 Migraine, unspecified, not intractable, without status migrainosus: Secondary | ICD-10-CM | POA: Diagnosis not present

## 2019-12-07 DIAGNOSIS — R509 Fever, unspecified: Secondary | ICD-10-CM | POA: Diagnosis not present

## 2019-12-07 DIAGNOSIS — R110 Nausea: Secondary | ICD-10-CM | POA: Diagnosis not present

## 2019-12-07 DIAGNOSIS — Z20822 Contact with and (suspected) exposure to covid-19: Secondary | ICD-10-CM | POA: Diagnosis not present

## 2019-12-13 DIAGNOSIS — G8929 Other chronic pain: Secondary | ICD-10-CM | POA: Diagnosis not present

## 2019-12-13 DIAGNOSIS — M5442 Lumbago with sciatica, left side: Secondary | ICD-10-CM | POA: Diagnosis not present

## 2019-12-13 DIAGNOSIS — M5441 Lumbago with sciatica, right side: Secondary | ICD-10-CM | POA: Diagnosis not present

## 2019-12-13 DIAGNOSIS — M545 Low back pain: Secondary | ICD-10-CM | POA: Diagnosis not present

## 2020-02-08 ENCOUNTER — Telehealth: Payer: Self-pay | Admitting: Obstetrics and Gynecology

## 2020-02-08 NOTE — Telephone Encounter (Signed)
Spoke with patient. Patient has [place] IUD placed 10/20/15, hx of BTL. Was seen by her PCP on 01/13/20 for UTI symptoms, tx with bactrim DS bid x7 days. Patient states urinalysis indicated blood in urine. Reports no menses for at least one year, started spotting today with mild menses like cramping. Patient states she is unsure if the blood is in urine or vaginally. Denies fever/chills, N/V, dysuria. Reports urinary frequency, voids normal amounts. Reports increased caffeine intake.   OV scheduled for 02/16/20 at 4:30pm with Dr. Quincy Simmonds. Patient declined earlier OV for 8/17 at 4:30pm due to her work schedule. Advised if new symptoms develop or symptoms worsen, return call to office. ER/Urgent Care precautions reviewed.   Last AEX 04/24/18 w/ Dr. Quincy Simmonds Last Well Adult exam with PCP 04/14/19  Advised Dr. Quincy Simmonds will review, our office will return call if any additional recommendations. Patient agreeable.  Routing to provider for final review. Patient is agreeable to disposition. Will close encounter.

## 2020-02-08 NOTE — Telephone Encounter (Signed)
Patient called to report that she is having blood in her urine.

## 2020-02-16 ENCOUNTER — Ambulatory Visit: Payer: 59 | Admitting: Obstetrics and Gynecology

## 2020-02-16 ENCOUNTER — Encounter: Payer: Self-pay | Admitting: Obstetrics and Gynecology

## 2020-02-16 ENCOUNTER — Telehealth: Payer: Self-pay | Admitting: Obstetrics and Gynecology

## 2020-02-16 NOTE — Telephone Encounter (Signed)
Patient cancelled appointment today because she is at work. Will call back to reschedule.

## 2020-02-16 NOTE — Telephone Encounter (Signed)
Thank you for the update!

## 2020-05-06 ENCOUNTER — Other Ambulatory Visit: Payer: Self-pay | Admitting: Endocrinology

## 2020-05-06 DIAGNOSIS — E041 Nontoxic single thyroid nodule: Secondary | ICD-10-CM

## 2020-06-29 ENCOUNTER — Encounter (HOSPITAL_COMMUNITY): Payer: Self-pay | Admitting: Emergency Medicine

## 2020-06-29 ENCOUNTER — Other Ambulatory Visit: Payer: Self-pay

## 2020-06-29 ENCOUNTER — Emergency Department (HOSPITAL_COMMUNITY)
Admission: EM | Admit: 2020-06-29 | Discharge: 2020-06-29 | Disposition: A | Discharge status: Home / Self Care | Payer: HRSA Program | Attending: Emergency Medicine | Admitting: Emergency Medicine

## 2020-06-29 DIAGNOSIS — U071 COVID-19: Secondary | ICD-10-CM | POA: Diagnosis not present

## 2020-06-29 DIAGNOSIS — R110 Nausea: Secondary | ICD-10-CM | POA: Insufficient documentation

## 2020-06-29 DIAGNOSIS — J3489 Other specified disorders of nose and nasal sinuses: Secondary | ICD-10-CM | POA: Diagnosis not present

## 2020-06-29 DIAGNOSIS — Z79899 Other long term (current) drug therapy: Secondary | ICD-10-CM | POA: Diagnosis not present

## 2020-06-29 DIAGNOSIS — M791 Myalgia, unspecified site: Secondary | ICD-10-CM | POA: Insufficient documentation

## 2020-06-29 DIAGNOSIS — R059 Cough, unspecified: Secondary | ICD-10-CM | POA: Diagnosis present

## 2020-06-29 LAB — POC SARS CORONAVIRUS 2 AG -  ED: SARS Coronavirus 2 Ag: POSITIVE — AB

## 2020-06-29 MED ORDER — BENZONATATE 100 MG PO CAPS: 100.0000 mg | ORAL_CAPSULE | Freq: Three times a day (TID) | ORAL | 0 refills | Status: DC

## 2020-06-29 MED ORDER — ONDANSETRON HCL 4 MG PO TABS: 4.0000 mg | ORAL_TABLET | Freq: Three times a day (TID) | ORAL | 0 refills | Status: DC | PRN

## 2020-06-29 NOTE — Discharge Instructions (Signed)
Return for new or worsening symptoms

## 2020-06-29 NOTE — ED Triage Notes (Signed)
Patient just got back from [place], visiting family and they are COVID positive. Patient reports headache, chills and emesis.

## 2020-06-29 NOTE — ED Provider Notes (Signed)
[facility] DEPT Provider Note   CSN: [id number] Arrival date & time: 06/29/20  1612     History Cough   Terry Tran is a 45 y.o. female with history significant for anxiety, anemia who presents for evaluation of cough.  She has had 2 doses of the Covid vaccine in April.  She has not had a booster.  Recently visited family and they were Covid positive.  She has had headache, myalgias, congestion, rhinorrhea, scratchy throat, cough.  Has had some nausea without emesis.  No sudden onset medical headache.  No associated paresthesias, weakness, neck pain, neck stiffness, difficulty with word finding.  Not take anything for symptoms.  She did have Covid in April.  Denies additional aggravating or alleviating factors.  History obtained from patient and past medical records.  No interpreter used.    HPI     Past Medical History:  Diagnosis Date  . Anemia   . Anxiety   . Thyroid disease    sees someone in [facility], [place]--Dr.Willaim [facility]    Patient Active Problem List   Diagnosis Date Noted  . Solitary thyroid nodule 10/18/2017  . Subclinical hypothyroidism 10/18/2017  . Anxiety state 03/25/2017    Past Surgical History:  Procedure Laterality Date  . TUBAL LIGATION       OB History    Gravida  2   Para  2   Term  2   Preterm  0   AB  0   Living  2     SAB  0   IAB  0   Ectopic  0   Multiple  0   Live Births  2           Family History  Problem Relation Age of Onset  . Cancer Father 47       prostate ca  . Cancer Maternal Grandmother        dec stomach ca?  . Cancer Paternal Grandfather        dec stomach ca?  . Stroke Maternal Uncle     Social History   Tobacco Use  . Smoking status: Never Smoker  . Smokeless tobacco: Never Used  Vaping Use  . Vaping Use: Never used  Substance Use Topics  . Alcohol use: Yes    Alcohol/week: 2.0 standard drinks    Types: 2 Glasses of wine per week  . Drug use: No     Home Medications Prior to Admission medications   Medication Sig Start Date End Date Taking? Authorizing Provider  benzonatate (TESSALON) 100 MG capsule Take 1 capsule (100 mg total) by mouth every 8 (eight) hours. 06/29/20  Yes Justice Milliron A, PA-C  ondansetron (ZOFRAN) 4 MG tablet Take 1 tablet (4 mg total) by mouth every 8 (eight) hours as needed for nausea or vomiting. 06/29/20  Yes Amol Domanski A, PA-C  azelastine (OPTIVAR) 0.05 % ophthalmic solution Place 1 drop into both eyes daily. 07/23/17   [provider]  buPROPion (WELLBUTRIN) 75 MG tablet Take 1 tablet by mouth daily. 12/16/17   [provider]  clonazePAM (KLONOPIN) 0.5 MG tablet Take 1 tablet by mouth as needed. 11/15/17   [provider]  cyclobenzaprine (FLEXERIL) 10 MG tablet Take 1 tablet by mouth as needed. 08/10/17   [provider]  escitalopram (LEXAPRO) 20 MG tablet Take 1 tablet by mouth daily. 07/19/16   [provider]  fluticasone (FLONASE) 50 MCG/ACT nasal spray Place 1 spray into the nose  as needed. 09/24/17   [provider]  levonorgestrel (MIRENA) 20 MCG/24HR IUD by Intrauterine route.    [provider]  levothyroxine (SYNTHROID, LEVOTHROID) 25 MCG tablet Take 1 tablet by mouth daily. 12/04/17   [provider]  topiramate (TOPAMAX) 25 MG tablet Take 25 mg by mouth. 01/12/16 01/11/17  [provider]  traZODone (DESYREL) 50 MG tablet Take 1 tablet by mouth at bedtime as needed. 02/14/15   [provider]    Allergies    Patient has no known allergies.  Review of Systems   Review of Systems  Constitutional: Positive for activity change, chills, fatigue and fever.  HENT: Positive for congestion, postnasal drip and rhinorrhea. Negative for sinus pressure, sinus pain and sore throat.   Respiratory: Positive for cough. Negative for apnea, choking, chest tightness, shortness of breath, wheezing and stridor.   Cardiovascular:  Negative.   Gastrointestinal: Positive for nausea. Negative for abdominal distention, abdominal pain, anal bleeding, blood in stool, constipation, diarrhea, rectal pain and vomiting.  Genitourinary: Negative.   Musculoskeletal: Positive for myalgias.  Skin: Negative.   Neurological: Negative.   All other systems reviewed and are negative.   Physical Exam Updated Vital Signs BP (!) 159/99 (BP Location: Left Arm)   Pulse 78   Temp 99 F (37.2 C) (Oral)   Resp 16   Ht 5\' 10"  (1.778 m)   Wt 90.7 kg   SpO2 99%   BMI 28.70 kg/m   Physical Exam Vitals and nursing note reviewed.  Constitutional:      General: She is not in acute distress.    Appearance: She is not ill-appearing, toxic-appearing or diaphoretic.  HENT:     Head: Normocephalic and atraumatic.     Jaw: There is normal jaw occlusion.     Ears:     Comments: No Mastoid tenderness.    Nose:     Comments: Clear rhinorrhea and congestion to bilateral nares.  No sinus tenderness.    Mouth/Throat:     Comments: Posterior oropharynx clear.  Mucous membranes moist.  Tonsils without erythema or exudate.  Uvula midline without deviation.  No evidence of PTA or RPA.  No drooling, dysphasia or trismus.  Phonation normal. Neck:     Trachea: Trachea and phonation normal.     Comments: No Neck stiffness or neck rigidity. No cervical lymphadenopathy. Cardiovascular:     Comments: No murmurs rubs or gallops. Pulmonary:     Effort: Pulmonary effort is normal.     Breath sounds: Normal breath sounds and air entry.     Comments: Clear to auscultation bilaterally without wheeze, rhonchi or rales.  No accessory muscle usage.  Able speak in full sentences. Abdominal:     General: Bowel sounds are normal.     Palpations: Abdomen is soft.     Tenderness: There is no abdominal tenderness. There is no right CVA tenderness, left CVA tenderness, guarding or rebound.     Hernia: No hernia is present.     Comments: Soft, nontender without  rebound or guarding.  No CVA tenderness.  Musculoskeletal:     Cervical back: Full passive range of motion without pain, normal range of motion and neck supple.     Comments: Moves all 4 extremities without difficulty.  Lower extremities without edema, erythema or warmth.  Skin:    Comments: Brisk capillary refill.  No rashes or lesions.  Neurological:     Mental Status: She is alert.     Comments: Ambulatory  in department without difficulty.  Cranial nerves II through XII grossly intact.  No facial droop.  No aphasia.    ED Results / Procedures / Treatments   Labs (all labs ordered are listed, but only abnormal results are displayed) Labs Reviewed  POC SARS CORONAVIRUS 2 AG -  ED - Abnormal; Notable for the following components:      Result Value   SARS Coronavirus 2 Ag POSITIVE (*)    All other components within normal limits    EKG None  Radiology No results found.  Procedures Procedures (including critical care time)  Medications Ordered in ED Medications - No data to display  ED Course  I have reviewed the triage vital signs and the nursing notes.  Pertinent labs & imaging results that were available during my care of the patient were reviewed by me and considered in my medical decision making (see chart for details).  45 year old presents for evaluation of cough and myalgias.  Afebrile, nonseptic, non-ill-appearing.  Heart lungs clear.  Abdomen is soft, nontender.  She has no neck stiffness or neck rigidity.  No meningismus.  Normal musculoskeletal exam.  Nonfocal neuro exam without deficits.  She is ambulatory without any hypoxia.  She has no tachycardia, tachypnea.  Appears otherwise well and well-hydrated.  Covid test here positive.  Likely cause of symptoms.  Discussed symptomatic management.  She agrees to follow-up outpatient.  The patient has been appropriately medically screened and/or stabilized in the ED. I have low suspicion for any other emergent medical  condition which would require further screening, evaluation or treatment in the ED or require inpatient management.  Patient is hemodynamically stable and in no acute distress.  Patient able to ambulate in department prior to ED.  Evaluation does not show acute pathology that would require ongoing or additional emergent interventions while in the emergency department or further inpatient treatment.  I have discussed the diagnosis with the patient and answered all questions.  Pain is been managed while in the emergency department and patient has no further complaints prior to discharge.  Patient is comfortable with plan discussed in room and is stable for discharge at this time.  I have discussed strict return precautions for returning to the emergency department.  Patient was encouraged to follow-up with PCP/specialist refer to at discharge.    MDM Rules/Calculators/A&P                          Terry Tran was evaluated in Emergency Department on 06/29/2020 for the symptoms described in the history of present illness. She was evaluated in the context of the global COVID-19 pandemic, which necessitated consideration that the patient might be at risk for infection with the SARS-CoV-2 virus that causes COVID-19. Institutional protocols and algorithms that pertain to the evaluation of patients at risk for COVID-19 are in a state of rapid change based on information released by regulatory bodies including the CDC and federal and state organizations. These policies and algorithms were followed during the patient's care in the ED. Final Clinical Impression(s) / ED Diagnoses Final diagnoses:  COVID    Rx / DC Orders ED Discharge Orders         Ordered    ondansetron (ZOFRAN) 4 MG tablet  Every 8 hours PRN        06/29/20 2120    benzonatate (TESSALON) 100 MG capsule  Every 8 hours        06/29/20 2120  Darris Staiger A, PA-C 06/29/20 2244    Drenda Freeze, MD 06/30/20  1556

## 2020-10-31 NOTE — Progress Notes (Signed)
45 y.o. [id number] Single African American female here for annual exam.  Appointment cancelled.  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    Patient complaining of low pelvic cramping like with a menses.   PCP:  Eldridge Abrahams, NP  No LMP recorded. (Menstrual status: IUD).     Period Cycle (Days):  (no cycles with Mirena IUD)     Sexually active: No.  The current method of family planning is tubal ligation/Mirena IUD 10/20/15.    Exercising: Yes.    walking, jump roping and cardio and yoga Smoker:  no  Health Maintenance: Pap: 04-24-18 Neg:Neg HR HPV, 02-23-15 Neg:neg HR HPV History of abnormal Pap:  no MMG:  07-08-17 Neg/BiRads1-- patient knows to schedule Colonoscopy:  NEVER BMD:   n/a  Result  n/a TDaP:  12-28-14 Gardasil:   no HIV: 04-13-09 NR Hep C: 04-20-20 Neg Screening Labs:  ---   reports that she has never smoked. She has never used smokeless tobacco. She reports previous alcohol use. She reports that she does not use drugs.  Past Medical History:  Diagnosis Date  . Anemia   . Anxiety   . Thyroid disease    sees someone in [facility], [place]--Dr.Willaim Tamala Julian    Past Surgical History:  Procedure Laterality Date  . TUBAL LIGATION      Current Outpatient Medications  Medication Sig Dispense Refill  . albuterol (VENTOLIN HFA) 108 (90 Base) MCG/ACT inhaler Inhale into the lungs.    Marland Kitchen azelastine (OPTIVAR) 0.05 % ophthalmic solution Place 1 drop into both eyes daily.    Marland Kitchen buPROPion (WELLBUTRIN) 75 MG tablet Take 1 tablet by mouth daily.    . cetirizine-pseudoephedrine (ZYRTEC-D) 5-120 MG tablet Take by mouth.    . clonazePAM (KLONOPIN) 0.5 MG tablet Take 1 tablet by mouth as needed.    . cyclobenzaprine (FLEXERIL) 10 MG tablet Take 1 tablet by mouth as needed.    Marland Kitchen escitalopram (LEXAPRO) 20 MG tablet Take 1 tablet by mouth daily.    . fluticasone (FLONASE) 50 MCG/ACT nasal spray Place 1 spray into the nose as needed.    Marland Kitchen levonorgestrel (MIRENA) 20 MCG/24HR IUD by  Intrauterine route.    . SUMAtriptan (IMITREX) 50 MG tablet Take one tab po at onset of migraine. May repeat 1 time after two hours if needed. No more than 2 in 24 hours    . traZODone (DESYREL) 50 MG tablet Take 1 tablet by mouth at bedtime as needed.  0  . ondansetron (ZOFRAN) 4 MG tablet Take 1 tablet (4 mg total) by mouth every 8 (eight) hours as needed for nausea or vomiting. 4 tablet 0  . topiramate (TOPAMAX) 25 MG tablet Take 25 mg by mouth.     No current facility-administered medications for this visit.    Family History  Problem Relation Age of Onset  . Cancer Father 53       prostate ca  . Leukemia Father        passed from Leukemia  . Cancer Maternal Grandmother        dec stomach ca?  . Cancer Paternal Grandfather        dec stomach ca?  . Stroke Maternal Uncle     Review of Systems  Genitourinary: Positive for pelvic pain (low pelvic cramps for several days--feels like a menses).  All other systems reviewed and are negative.   Exam:   BP 128/82 (Cuff Size: Large)   Pulse 80   Resp 16  Ht 5' 9.5" (1.765 m)   Wt 195 lb (88.5 kg)   BMI 28.38 kg/m        Appointment cancelled.  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

## 2020-11-01 ENCOUNTER — Other Ambulatory Visit: Payer: Self-pay

## 2020-11-01 ENCOUNTER — Encounter: Payer: Commercial Managed Care - PPO | Admitting: Obstetrics and Gynecology

## 2020-11-01 ENCOUNTER — Encounter: Payer: Self-pay | Admitting: Obstetrics and Gynecology

## 2020-11-03 NOTE — Progress Notes (Deleted)
45 y.o. [id number] Single African American female here for annual exam.       PCP:     No LMP recorded. (Menstrual status: IUD).           Sexually active: {yes no:314532}  The current method of family planning is tubal ligation/Mirena IUD 10/20/15.    Exercising: Yes.    walking, jump roping, cardio and yoga Smoker:  no  Health Maintenance: Pap:  04-24-18 Neg:Neg HR HPV, 02-23-15 Neg:neg HR HPV History of abnormal Pap:  no MMG: 07-08-17 Neg/BiRads1-- patient knows to schedule Colonoscopy:  NEVER BMD:   n/a  Result  n/a TDaP:  12-28-14 Gardasil:   no HIV: 04-14-19 NR Hep C: 04-20-20 Neg Screening Labs:  Hb today: ***, Urine today: ***   reports that she has never smoked. She has never used smokeless tobacco. She reports previous alcohol use. She reports that she does not use drugs.  Past Medical History:  Diagnosis Date  . Anemia   . Anxiety   . Thyroid disease    sees someone in [facility], [place]--Dr.Willaim Tamala Julian    Past Surgical History:  Procedure Laterality Date  . TUBAL LIGATION      Current Outpatient Medications  Medication Sig Dispense Refill  . albuterol (VENTOLIN HFA) 108 (90 Base) MCG/ACT inhaler Inhale into the lungs.    Marland Kitchen azelastine (OPTIVAR) 0.05 % ophthalmic solution Place 1 drop into both eyes daily.    Marland Kitchen buPROPion (WELLBUTRIN) 75 MG tablet Take 1 tablet by mouth daily.    . cetirizine-pseudoephedrine (ZYRTEC-D) 5-120 MG tablet Take by mouth.    . clonazePAM (KLONOPIN) 0.5 MG tablet Take 1 tablet by mouth as needed.    . cyclobenzaprine (FLEXERIL) 10 MG tablet Take 1 tablet by mouth as needed.    Marland Kitchen escitalopram (LEXAPRO) 20 MG tablet Take 1 tablet by mouth daily.    . fluticasone (FLONASE) 50 MCG/ACT nasal spray Place 1 spray into the nose as needed.    Marland Kitchen levonorgestrel (MIRENA) 20 MCG/24HR IUD by Intrauterine route.    . ondansetron (ZOFRAN) 4 MG tablet Take 1 tablet (4 mg total) by mouth every 8 (eight) hours as needed for nausea or vomiting. 4 tablet 0  .  SUMAtriptan (IMITREX) 50 MG tablet Take one tab po at onset of migraine. May repeat 1 time after two hours if needed. No more than 2 in 24 hours    . topiramate (TOPAMAX) 25 MG tablet Take 25 mg by mouth.    . traZODone (DESYREL) 50 MG tablet Take 1 tablet by mouth at bedtime as needed.  0   No current facility-administered medications for this visit.    Family History  Problem Relation Age of Onset  . Cancer Father 34       prostate ca  . Leukemia Father        passed from Leukemia  . Cancer Maternal Grandmother        dec stomach ca?  . Cancer Paternal Grandfather        dec stomach ca?  . Stroke Maternal Uncle     Review of Systems  Exam:   There were no vitals taken for this visit.    General appearance: alert, cooperative and appears stated age Head: normocephalic, without obvious abnormality, atraumatic Neck: no adenopathy, supple, symmetrical, trachea midline and thyroid normal to inspection and palpation Lungs: clear to auscultation bilaterally Breasts: normal appearance, no masses or tenderness, No nipple retraction or dimpling, No nipple discharge or bleeding, No  axillary adenopathy Heart: regular rate and rhythm Abdomen: soft, non-tender; no masses, no organomegaly Extremities: extremities normal, atraumatic, no cyanosis or edema Skin: skin color, texture, turgor normal. No rashes or lesions Lymph nodes: cervical, supraclavicular, and axillary nodes normal. Neurologic: grossly normal  Pelvic: External genitalia:  no lesions              No abnormal inguinal nodes palpated.              Urethra:  normal appearing urethra with no masses, tenderness or lesions              Bartholins and Skenes: normal                 Vagina: normal appearing vagina with normal color and discharge, no lesions              Cervix: no lesions              Pap taken: {yes no:314532} Bimanual Exam:  Uterus:  normal size, contour, position, consistency, mobility, non-tender               Adnexa: no mass, fullness, tenderness              Rectal exam: {yes no:314532}.  Confirms.              Anus:  normal sphincter tone, no lesions  Chaperone was present for exam.  Assessment:   Well woman visit with normal exam.   Plan: Mammogram screening discussed. Self breast awareness reviewed. Pap and HR HPV as above. Guidelines for Calcium, Vitamin D, regular exercise program including cardiovascular and weight bearing exercise.   Follow up annually and prn.   Additional counseling given.  {yes [id number]. _______ minutes face to face time of which over 50% was spent in counseling.    After visit summary provided.

## 2020-11-08 ENCOUNTER — Ambulatory Visit: Payer: Commercial Managed Care - PPO | Admitting: Obstetrics and Gynecology

## 2021-01-30 ENCOUNTER — Ambulatory Visit: Payer: Commercial Managed Care - PPO | Admitting: Obstetrics and Gynecology

## 2021-01-30 DIAGNOSIS — Z0289 Encounter for other administrative examinations: Secondary | ICD-10-CM

## 2021-01-30 NOTE — Progress Notes (Deleted)
GYNECOLOGY  VISIT   HPI: 45 y.o.   Single  African American  female   [phone] with No LMP recorded. (Menstrual status: IUD).   here for     GYNECOLOGIC HISTORY: No LMP recorded. (Menstrual status: IUD). Contraception: Tubal/Mirena IUD 10-20-15 Menopausal hormone therapy:  n/a Last mammogram: ***07-08-17 Neg/BiRads1 Last pap smear: 04-24-18 Neg:Neg HR HPV, 02-23-15 Neg:neg HR HPV        OB History     Gravida  2   Para  2   Term  2   Preterm  0   AB  0   Living  2      SAB  0   IAB  0   Ectopic  0   Multiple  0   Live Births  2              Patient Active Problem List   Diagnosis Date Noted   Solitary thyroid nodule 10/18/2017   Subclinical hypothyroidism 10/18/2017   Anxiety state 03/25/2017    Past Medical History:  Diagnosis Date   Anemia    Anxiety    Thyroid disease    sees someone in [place], [place]--Dr.Willaim Elgin    Past Surgical History:  Procedure Laterality Date   TUBAL LIGATION      Current Outpatient Medications  Medication Sig Dispense Refill   albuterol (VENTOLIN HFA) 108 (90 Base) MCG/ACT inhaler Inhale into the lungs.     azelastine (OPTIVAR) 0.05 % ophthalmic solution Place 1 drop into both eyes daily.     buPROPion (WELLBUTRIN) 75 MG tablet Take 1 tablet by mouth daily.     cetirizine-pseudoephedrine (ZYRTEC-D) 5-120 MG tablet Take by mouth.     clonazePAM (KLONOPIN) 0.5 MG tablet Take 1 tablet by mouth as needed.     cyclobenzaprine (FLEXERIL) 10 MG tablet Take 1 tablet by mouth as needed.     escitalopram (LEXAPRO) 20 MG tablet Take 1 tablet by mouth daily.     fluticasone (FLONASE) 50 MCG/ACT nasal spray Place 1 spray into the nose as needed.     levonorgestrel (MIRENA) 20 MCG/24HR IUD by Intrauterine route.     ondansetron (ZOFRAN) 4 MG tablet Take 1 tablet (4 mg total) by mouth every 8 (eight) hours as needed for nausea or vomiting. 4 tablet 0   SUMAtriptan (IMITREX) 50 MG tablet Take one tab po at onset of migraine.  May repeat 1 time after two hours if needed. No more than 2 in 24 hours     topiramate (TOPAMAX) 25 MG tablet Take 25 mg by mouth.     traZODone (DESYREL) 50 MG tablet Take 1 tablet by mouth at bedtime as needed.  0   No current facility-administered medications for this visit.     ALLERGIES: Patient has no known allergies.  Family History  Problem Relation Age of Onset   Cancer Father 70       prostate ca   Leukemia Father        passed from Leukemia   Cancer Maternal Grandmother        dec stomach ca?   Cancer Paternal Grandfather        dec stomach ca?   Stroke Maternal Uncle     Social History   Socioeconomic History   Marital status: Single    Spouse name: Not on file   Number of children: Not on file   Years of education: Not on file   Highest education level: Not on file  Occupational History   Not on file  Tobacco Use   Smoking status: Never   Smokeless tobacco: Never  Vaping Use   Vaping Use: Never used  Substance and Sexual Activity   Alcohol use: Not Currently    Alcohol/week: 0.0 standard drinks   Drug use: No   Sexual activity: Not Currently    Partners: Male    Birth control/protection: Condom, I.U.D., Surgical    Comment: BTL/Mirena IUD inserted 10-20-15  Other Topics Concern   Not on file  Social History Narrative   Not on file   Social Determinants of Health   Financial Resource Strain: Not on file  Food Insecurity: Not on file  Transportation Needs: Not on file  Physical Activity: Not on file  Stress: Not on file  Social Connections: Not on file  Intimate Partner Violence: Not on file    Review of Systems  PHYSICAL EXAMINATION:    There were no vitals taken for this visit.    General appearance: alert, cooperative and appears stated age Head: Normocephalic, without obvious abnormality, atraumatic Neck: no adenopathy, supple, symmetrical, trachea midline and thyroid normal to inspection and palpation Lungs: clear to auscultation  bilaterally Breasts: normal appearance, no masses or tenderness, No nipple retraction or dimpling, No nipple discharge or bleeding, No axillary or supraclavicular adenopathy Heart: regular rate and rhythm Abdomen: soft, non-tender, no masses,  no organomegaly Extremities: extremities normal, atraumatic, no cyanosis or edema Skin: Skin color, texture, turgor normal. No rashes or lesions Lymph nodes: Cervical, supraclavicular, and axillary nodes normal. No abnormal inguinal nodes palpated Neurologic: Grossly normal  Pelvic: External genitalia:  no lesions              Urethra:  normal appearing urethra with no masses, tenderness or lesions              Bartholins and Skenes: normal                 Vagina: normal appearing vagina with normal color and discharge, no lesions              Cervix: no lesions                Bimanual Exam:  Uterus:  normal size, contour, position, consistency, mobility, non-tender              Adnexa: no mass, fullness, tenderness              Rectal exam: {yes no:314532}.  Confirms.              Anus:  normal sphincter tone, no lesions  Chaperone was present for exam:  ***  ASSESSMENT     PLAN     An After Visit Summary was printed and given to the patient.  ______ minutes face to face time of which over 50% was spent in counseling.

## 2021-03-21 NOTE — Progress Notes (Signed)
45 y.o. [id number] Single African American female here for annual exam.    Patient states bleeding daily x 2 months. It is not heavy bleeding, but more like spotting to light bleeding. She wears panty liner daily.  Having odor with the bleeding. She has the Mirena IUD due to menorrhagia and anemia. This also helps her cramping.  She wants to continue with her IUD.   Las sexual activity one year ago.  Uses condoms.   Wants STD testing.   PCP: Eldridge Abrahams, NP  No LMP recorded. (Menstrual status: IUD).     Period Cycle (Days):  (spotting with  Mirena IUD)     Sexually active: No.  The current method of family planning is tubal ligation/Mirena 10-20-15.    Exercising: Yes.     Jogging, jumping rope, cardio, squats Smoker:  no  Health Maintenance: Pap: 02-23-15 Neg:neg HR HPV History of abnormal Pap:  no MMG:  07-08-17 3D/Neg/Density B/BiRads1--knows to schedule  Colonoscopy:  never  BMD:   n/a  Result  n/a TDaP: 12-28-14 Gardasil:  completed 2 HIV: 02-23-15 Neg Hep C: 02-23-15 Neg Screening Labs:  PCP.    reports that she has never smoked. She has never used smokeless tobacco. She reports current alcohol use of about 2.0 standard drinks per week. She reports that she does not use drugs.  Past Medical History:  Diagnosis Date   Anemia    Anxiety    Migraine    with aura   Thyroid disease    sees someone in [place], [place]--Dr.Willaim Tamala Julian    Past Surgical History:  Procedure Laterality Date   TUBAL LIGATION      Current Outpatient Medications  Medication Sig Dispense Refill   albuterol (VENTOLIN HFA) 108 (90 Base) MCG/ACT inhaler Inhale into the lungs.     azelastine (OPTIVAR) 0.05 % ophthalmic solution Place 1 drop into both eyes daily.     buPROPion (WELLBUTRIN) 75 MG tablet Take 1 tablet by mouth daily.     cetirizine-pseudoephedrine (ZYRTEC-D) 5-120 MG tablet Take by mouth.     clonazePAM (KLONOPIN) 0.5 MG tablet Take 1 tablet by mouth as needed.     cyclobenzaprine  (FLEXERIL) 10 MG tablet Take 1 tablet by mouth as needed.     escitalopram (LEXAPRO) 20 MG tablet Take 1 tablet by mouth daily.     fluticasone (FLONASE) 50 MCG/ACT nasal spray Place 1 spray into the nose as needed.     levonorgestrel (MIRENA) 20 MCG/24HR IUD by Intrauterine route.     ondansetron (ZOFRAN-ODT) 4 MG disintegrating tablet Take 2 mg by mouth every 6 (six) hours as needed.     SUMAtriptan (IMITREX) 50 MG tablet Take one tab po at onset of migraine. May repeat 1 time after two hours if needed. No more than 2 in 24 hours     traZODone (DESYREL) 50 MG tablet Take 1 tablet by mouth at bedtime as needed.  0   topiramate (TOPAMAX) 25 MG tablet Take 25 mg by mouth.     No current facility-administered medications for this visit.    Family History  Problem Relation Age of Onset   Cancer Father 36       prostate ca   Leukemia Father        passed from Leukemia   Stroke Maternal Uncle    Cancer Paternal Uncle 36       colon cancer   Cancer Maternal Grandmother        dec stomach ca?  Cancer Paternal Grandfather        dec stomach ca?    Review of Systems  All other systems reviewed and are negative.  Exam:   BP 112/70   Pulse 88   Resp 18   Ht 5' 9.5" (1.765 m)   Wt 194 lb (88 kg)   BMI 28.24 kg/m     General appearance: alert, cooperative and appears stated age Head: normocephalic, without obvious abnormality, atraumatic Neck: no adenopathy, supple, symmetrical, trachea midline and thyroid globally enlarged. Lungs: clear to auscultation bilaterally Breasts: normal appearance, no masses or tenderness, No nipple retraction or dimpling, No nipple discharge or bleeding, No axillary adenopathy Heart: regular rate and rhythm Abdomen: soft, non-tender; no masses, no organomegaly Extremities: extremities normal, atraumatic, no cyanosis or edema Skin: skin color, texture, turgor normal. No rashes or lesions Lymph nodes: cervical, supraclavicular, and axillary nodes  normal. Neurologic: grossly normal  Pelvic: External genitalia:  no lesions              No abnormal inguinal nodes palpated.              Urethra:  normal appearing urethra with no masses, tenderness or lesions              Bartholins and Skenes: normal                 Vagina: normal appearing vagina with normal color and discharge, no lesions              Cervix: no lesions. IUD strings noted.              Pap taken:  yes. Bimanual Exam:  Uterus:  normal size, contour, position, consistency, mobility, non-tender              Adnexa: no mass, fullness, tenderness              Rectal exam: yes.  Confirms.              Anus:  normal sphincter tone, no lesions  Chaperone was present for exam:  Raquel Sarna, RN  Assessment:   Well woman visit with gynecologic exam. Mirena IUD, placed 10/20/15. Vaginal spotting.  Vaginal odor. Fibroids.  Status post BTL.  Migraine with aura.  Hypothyroidism.  Goiter. FH colon cancer - age 68 in father and 54 in paternal uncle.  Plan: Mammogram screening discussed.  She will schedule.  Self breast awareness reviewed. Pap and HR HPV as above. Guidelines for Calcium, Vitamin D, regular exercise program including cardiovascular and weight bearing exercise. STD screening.  Vaginitis testing.  Referral for colonoscopy.  She will monitor her bleeding and call back if persists or increases. Would then consider pelvic US.  We discussed her Mirena IUD is effective for 8 years.  Follow up annually and prn.   After visit summary provided.

## 2021-03-23 ENCOUNTER — Encounter: Payer: Self-pay | Admitting: Obstetrics and Gynecology

## 2021-03-23 ENCOUNTER — Other Ambulatory Visit (HOSPITAL_COMMUNITY)
Admission: RE | Admit: 2021-03-23 | Discharge: 2021-03-23 | Disposition: A | Discharge status: Home / Self Care | Payer: Commercial Managed Care - PPO | Source: Ambulatory Visit | Attending: Obstetrics and Gynecology | Admitting: Obstetrics and Gynecology

## 2021-03-23 ENCOUNTER — Other Ambulatory Visit: Payer: Self-pay

## 2021-03-23 ENCOUNTER — Ambulatory Visit (INDEPENDENT_AMBULATORY_CARE_PROVIDER_SITE_OTHER): Payer: No Typology Code available for payment source | Admitting: Obstetrics and Gynecology

## 2021-03-23 VITALS — BP 112/70 | HR 88 | Resp 18 | Ht 69.5 in | Wt 194.0 lb

## 2021-03-23 DIAGNOSIS — N898 Other specified noninflammatory disorders of vagina: Secondary | ICD-10-CM | POA: Insufficient documentation

## 2021-03-23 DIAGNOSIS — Z01419 Encounter for gynecological examination (general) (routine) without abnormal findings: Secondary | ICD-10-CM | POA: Diagnosis present

## 2021-03-23 DIAGNOSIS — Z113 Encounter for screening for infections with a predominantly sexual mode of transmission: Secondary | ICD-10-CM

## 2021-03-23 DIAGNOSIS — Z800 Family history of malignant neoplasm of digestive organs: Secondary | ICD-10-CM

## 2021-03-23 NOTE — Patient Instructions (Signed)

## 2021-03-24 LAB — CERVICOVAGINAL ANCILLARY ONLY
Bacterial Vaginitis (gardnerella): POSITIVE — AB
Candida Glabrata: NEGATIVE
Candida Vaginitis: NEGATIVE
Comment: NEGATIVE
Comment: NEGATIVE
Comment: NEGATIVE
Comment: NEGATIVE
Trichomonas: NEGATIVE

## 2021-03-24 LAB — HIV ANTIBODY (ROUTINE TESTING W REFLEX): HIV 1&2 Ab, 4th Generation: NONREACTIVE

## 2021-03-24 LAB — HEPATITIS C ANTIBODY
Hepatitis C Ab: NONREACTIVE
SIGNAL TO CUT-OFF: 0.03 (ref <1.00)

## 2021-03-24 LAB — HEPATITIS B SURFACE ANTIGEN: Hepatitis B Surface Ag: NONREACTIVE

## 2021-03-24 LAB — RPR: RPR Ser Ql: NONREACTIVE

## 2021-03-27 ENCOUNTER — Other Ambulatory Visit: Payer: Self-pay

## 2021-03-27 LAB — CYTOLOGY - PAP
Chlamydia: NEGATIVE
Comment: NEGATIVE
Comment: NEGATIVE
Comment: NEGATIVE
Comment: NORMAL
Diagnosis: UNDETERMINED — AB
High risk HPV: NEGATIVE
Neisseria Gonorrhea: NEGATIVE
Trichomonas: NEGATIVE

## 2021-03-27 MED ORDER — METRONIDAZOLE 0.75 % VA GEL: VAGINAL | 0 refills | Status: DC

## 2021-07-19 ENCOUNTER — Emergency Department (HOSPITAL_COMMUNITY): Payer: Commercial Managed Care - PPO

## 2021-07-19 ENCOUNTER — Encounter (HOSPITAL_COMMUNITY): Payer: Self-pay

## 2021-07-19 ENCOUNTER — Emergency Department (HOSPITAL_COMMUNITY)
Admission: EM | Admit: 2021-07-19 | Discharge: 2021-07-19 | Disposition: A | Discharge status: Home / Self Care | Payer: Commercial Managed Care - PPO | Attending: Emergency Medicine | Admitting: Emergency Medicine

## 2021-07-19 DIAGNOSIS — M545 Low back pain, unspecified: Secondary | ICD-10-CM | POA: Diagnosis not present

## 2021-07-19 DIAGNOSIS — R1031 Right lower quadrant pain: Secondary | ICD-10-CM | POA: Insufficient documentation

## 2021-07-19 DIAGNOSIS — E876 Hypokalemia: Secondary | ICD-10-CM | POA: Diagnosis not present

## 2021-07-19 DIAGNOSIS — O26899 Other specified pregnancy related conditions, unspecified trimester: Secondary | ICD-10-CM | POA: Insufficient documentation

## 2021-07-19 DIAGNOSIS — R109 Unspecified abdominal pain: Secondary | ICD-10-CM

## 2021-07-19 LAB — URINALYSIS, ROUTINE W REFLEX MICROSCOPIC
Bilirubin Urine: NEGATIVE
Glucose, UA: NEGATIVE mg/dL
Hgb urine dipstick: NEGATIVE
Ketones, ur: NEGATIVE mg/dL
Leukocytes,Ua: NEGATIVE
Nitrite: NEGATIVE
Protein, ur: NEGATIVE mg/dL
Specific Gravity, Urine: >1.046 — ABNORMAL HIGH (ref 1.005–1.030)
pH: 7 (ref 5.0–8.0)

## 2021-07-19 LAB — CBC WITH DIFFERENTIAL/PLATELET
Abs Immature Granulocytes: 0.01 10*3/uL (ref 0.00–0.07)
Basophils Absolute: 0 10*3/uL (ref 0.0–0.1)
Basophils Relative: 0 %
Eosinophils Absolute: 0.1 10*3/uL (ref 0.0–0.5)
Eosinophils Relative: 2 %
HCT: 26.6 % — ABNORMAL LOW (ref 36.0–46.0)
Hemoglobin: 9.4 g/dL — ABNORMAL LOW (ref 12.0–15.0)
Immature Granulocytes: 0 %
Lymphocytes Relative: 27 %
Lymphs Abs: 2 10*3/uL (ref 0.7–4.0)
MCH: 27.4 pg (ref 26.0–34.0)
MCHC: 35.3 g/dL (ref 30.0–36.0)
MCV: 77.6 fL — ABNORMAL LOW (ref 80.0–100.0)
Monocytes Absolute: 0.6 10*3/uL (ref 0.1–1.0)
Monocytes Relative: 8 %
Neutro Abs: 4.7 10*3/uL (ref 1.7–7.7)
Neutrophils Relative %: 63 %
Platelets: 224 10*3/uL (ref 150–400)
RBC: 3.43 MIL/uL — ABNORMAL LOW (ref 3.87–5.11)
RDW: 14.6 % (ref 11.5–15.5)
WBC: 7.5 10*3/uL (ref 4.0–10.5)
nRBC: 0 % (ref 0.0–0.2)

## 2021-07-19 LAB — COMPREHENSIVE METABOLIC PANEL
ALT: 11 U/L (ref 0–44)
AST: 13 U/L — ABNORMAL LOW (ref 15–41)
Albumin: 4.3 g/dL (ref 3.5–5.0)
Alkaline Phosphatase: 60 U/L (ref 38–126)
Anion gap: 6 (ref 5–15)
BUN: 8 mg/dL (ref 6–20)
CO2: 29 mmol/L (ref 22–32)
Calcium: 9 mg/dL (ref 8.9–10.3)
Chloride: 103 mmol/L (ref 98–111)
Creatinine, Ser: 0.71 mg/dL (ref 0.44–1.00)
GFR, Estimated: >60 mL/min (ref >60)
Glucose, Bld: 96 mg/dL (ref 70–99)
Potassium: 2.7 mmol/L — CL (ref 3.5–5.1)
Sodium: 138 mmol/L (ref 135–145)
Total Bilirubin: 0.6 mg/dL (ref 0.3–1.2)
Total Protein: 7.3 g/dL (ref 6.5–8.1)

## 2021-07-19 LAB — LIPASE, BLOOD: Lipase: 23 U/L (ref 11–51)

## 2021-07-19 LAB — HCG, QUANTITATIVE, PREGNANCY: hCG, Beta Chain, Quant, S: 8 m[IU]/mL — ABNORMAL HIGH (ref <5)

## 2021-07-19 MED ORDER — POTASSIUM CHLORIDE CRYS ER 20 MEQ PO TBCR
40.0000 meq | EXTENDED_RELEASE_TABLET | Freq: Once | ORAL | Status: AC
  Administered 2021-07-19: 21:00:00 40 meq via ORAL
  Filled 2021-07-19: qty 2

## 2021-07-19 MED ORDER — FENTANYL CITRATE PF 50 MCG/ML IJ SOSY
50.0000 ug | PREFILLED_SYRINGE | Freq: Once | INTRAMUSCULAR | Status: AC
  Administered 2021-07-19: 21:00:00 50 ug via INTRAVENOUS
  Filled 2021-07-19: qty 1

## 2021-07-19 MED ORDER — IOHEXOL 350 MG/ML SOLN
100.0000 mL | Freq: Once | INTRAVENOUS | Status: AC | PRN
  Administered 2021-07-19: 18:00:00 100 mL via INTRAVENOUS

## 2021-07-19 MED ORDER — FENTANYL CITRATE PF 50 MCG/ML IJ SOSY
50.0000 ug | PREFILLED_SYRINGE | Freq: Once | INTRAMUSCULAR | Status: AC
  Administered 2021-07-19: 16:00:00 50 ug via INTRAVENOUS
  Filled 2021-07-19: qty 1

## 2021-07-19 MED ORDER — ACETAMINOPHEN 325 MG PO TABS
650.0000 mg | ORAL_TABLET | Freq: Once | ORAL | Status: AC
  Administered 2021-07-19: 16:00:00 650 mg via ORAL
  Filled 2021-07-19: qty 2

## 2021-07-19 NOTE — ED Triage Notes (Signed)
Pt presents with c/o abdominal pain present for approx one week. Pt reports she went to UC and was told she was pregnant. Pt reports she has an IUD and has her tubes tied.

## 2021-07-19 NOTE — ED Notes (Signed)
Pt aware UA is needed. Pt in restroom.

## 2021-07-19 NOTE — Discharge Instructions (Addendum)
Please follow-up with your primary care doctor and your gynecologist.  Discussed her symptoms from today.  Additionally recommend discussing your slight elevation in the beta-hCG level with your gynecologist (your level was 8 mIU/mL today).  Recommend taking the previously prescribed muscle relaxer as needed as well as anti-inflammatories or Tylenol as needed.  Your potassium was mildly low, please eat an extra banana daily and have your primary care doctor repeat your electrolytes sometime next week.  The radiologist noted: "12 mm nodule in the left adrenal gland, nonspecific but statistically most likely reflects adenoma. This could be further characterized with adrenal protocol MRI if felt clinically relevant."  Discussed this with your primary care doctor.  They can arrange additional imaging if appropriate.  If you feel your symptoms are worsening, you develop vomiting or other new concerning symptom, fever, come back to ER for reassessment.

## 2021-07-19 NOTE — ED Provider Notes (Signed)
[facility] DEPT Provider Note   CSN: [id number] Arrival date & time: 07/19/21  1400     History  Chief Complaint  Patient presents with   Abdominal Pain    Terry Tran is a 46 y.o. female.  Presents to ER with concern for abdominal pain.  Patient has been having pain for about 1 week.  Pain is worse on her right abdomen, right flank/right low back.  Moderate to severe.  Comes and goes.  No alleviating or aggravating factors.  Has tried some over-the-counter meds and muscle relaxer without relief.  About 2 weeks ago a had some vaginal bleeding.  This however stopped and she does not have any ongoing bleeding today.  No vaginal discharge.  States that she was last sexually active in November.  Has IUD.  Has a gynecologist.  Martin Majestic to an urgent care and was told that she may be pregnant and needed to go to the ER.  HPI     Home Medications Prior to Admission medications   Medication Sig Start Date End Date Taking? Authorizing Provider  albuterol (VENTOLIN HFA) 108 (90 Base) MCG/ACT inhaler Inhale into the lungs. 10/08/19   [provider]  azelastine (OPTIVAR) 0.05 % ophthalmic solution Place 1 drop into both eyes daily. 07/23/17   [provider]  buPROPion (WELLBUTRIN) 75 MG tablet Take 1 tablet by mouth daily. 12/16/17   [provider]  cetirizine-pseudoephedrine (ZYRTEC-D) 5-120 MG tablet Take by mouth. 09/30/19   [provider]  clonazePAM (KLONOPIN) 0.5 MG tablet Take 1 tablet by mouth as needed. 11/15/17   [provider]  cyclobenzaprine (FLEXERIL) 10 MG tablet Take 1 tablet by mouth as needed. 08/10/17   [provider]  escitalopram (LEXAPRO) 20 MG tablet Take 1 tablet by mouth daily. 07/19/16   [provider]  fluticasone (FLONASE) 50 MCG/ACT nasal spray Place 1 spray into the nose as needed. 09/24/17   [provider]  levonorgestrel (MIRENA) 20 MCG/24HR IUD by Intrauterine  route.    [provider]  metroNIDAZOLE (METROGEL) 0.75 % vaginal gel Insert appful intravaginally hs x 5 nights. 03/27/21   Nunzio Cobbs, MD  ondansetron (ZOFRAN-ODT) 4 MG disintegrating tablet Take 2 mg by mouth every 6 (six) hours as needed. 11/12/20   [provider]  SUMAtriptan (IMITREX) 50 MG tablet Take one tab po at onset of migraine. May repeat 1 time after two hours if needed. No more than 2 in 24 hours 04/20/20   [provider]  topiramate (TOPAMAX) 25 MG tablet Take 25 mg by mouth. 01/12/16 01/11/17  [provider]  traZODone (DESYREL) 50 MG tablet Take 1 tablet by mouth at bedtime as needed. 02/14/15   [provider]      Allergies    Patient has no known allergies.    Review of Systems   Review of Systems  Constitutional:  Positive for fatigue. Negative for chills and fever.  HENT:  Negative for ear pain and sore throat.   Eyes:  Negative for pain and visual disturbance.  Respiratory:  Negative for cough and shortness of breath.   Cardiovascular:  Negative for chest pain and palpitations.  Gastrointestinal:  Positive for abdominal pain and nausea. Negative for vomiting.  Genitourinary:  Positive for vaginal bleeding. Negative for dysuria and hematuria.  Musculoskeletal:  Positive for back pain. Negative for arthralgias.  Skin:  Negative for color change and rash.  Neurological:  Negative for seizures and  syncope.  All other systems reviewed and are negative.  Physical Exam Updated Vital Signs BP (!) 150/85 (BP Location: Right Arm)    Pulse 80    Temp 98 F (36.7 C) (Oral)    Resp 16    Ht 5\' 10"  (1.778 m)    Wt 83.9 kg    SpO2 99%    BMI 26.54 kg/m  Physical Exam Vitals and nursing note reviewed.  Constitutional:      General: She is not in acute distress.    Appearance: She is well-developed.  HENT:     Head: Normocephalic and atraumatic.  Eyes:     Conjunctiva/sclera: Conjunctivae normal.  Cardiovascular:      Rate and Rhythm: Normal rate and regular rhythm.     Heart sounds: No murmur heard. Pulmonary:     Effort: Pulmonary effort is normal. No respiratory distress.     Breath sounds: Normal breath sounds.  Abdominal:     Palpations: Abdomen is soft.     Tenderness: There is no abdominal tenderness.     Comments: Some tenderness in the right lower quadrant, right flank but no rebound or guarding  Musculoskeletal:        General: No swelling.     Cervical back: Neck supple.  Skin:    General: Skin is warm and dry.     Capillary Refill: Capillary refill takes less than 2 seconds.  Neurological:     Mental Status: She is alert.  Psychiatric:        Mood and Affect: Mood normal.    ED Results / Procedures / Treatments   Labs (all labs ordered are listed, but only abnormal results are displayed) Labs Reviewed  HCG, QUANTITATIVE, PREGNANCY - Abnormal; Notable for the following components:      Result Value   hCG, Beta Chain, Quant, S 8 (*)    All other components within normal limits  CBC WITH DIFFERENTIAL/PLATELET - Abnormal; Notable for the following components:   RBC 3.43 (*)    Hemoglobin 9.4 (*)    HCT 26.6 (*)    MCV 77.6 (*)    All other components within normal limits  COMPREHENSIVE METABOLIC PANEL - Abnormal; Notable for the following components:   Potassium 2.7 (*)    AST 13 (*)    All other components within normal limits  URINALYSIS, ROUTINE W REFLEX MICROSCOPIC - Abnormal; Notable for the following components:   Specific Gravity, Urine >1.046 (*)    All other components within normal limits  LIPASE, BLOOD    EKG None  Radiology US Transvaginal Non-OB  Result Date: 07/19/2021 CLINICAL DATA:  Right lower quadrant pain, concern for adnexal torsion EXAM: TRANSABDOMINAL AND TRANSVAGINAL ULTRASOUND OF PELVIS TECHNIQUE: Both transabdominal and transvaginal ultrasound examinations of the pelvis were performed. Transabdominal technique was performed for global imaging  of the pelvis including uterus, ovaries, adnexal regions, and pelvic cul-de-sac. It was necessary to proceed with endovaginal exam following the transabdominal exam to visualize the adnexal regions. COMPARISON:  04/03/2018 FINDINGS: Uterus Measurements: 8.3 x 3.6 x 4.5 cm = volume: 71 mL. Anteverted. Posterior uterine body fibroid measures up to 1.8 cm. Endometrium Thickness: 6.5 mm. No focal abnormality visualized. IUD is present within the endometrial canal. Right ovary Not visualized.  Obscured by overlying bowel gas. Left ovary Not visualized.  Obscured by overlying bowel gas. Other findings No abnormal free fluid. IMPRESSION: 1. Nonvisualization of the bilateral ovaries, which were obscured by overlying bowel gas. Adnexal torsion is unable  to be assessed by this study. 2. Small posterior uterine body fibroid. 3. No free fluid within the pelvis. Electronically Signed   By: Davina Poke D.O.   On: 07/19/2021 16:50   US Pelvis Complete  Result Date: 07/19/2021 CLINICAL DATA:  Right lower quadrant pain, concern for adnexal torsion EXAM: TRANSABDOMINAL AND TRANSVAGINAL ULTRASOUND OF PELVIS TECHNIQUE: Both transabdominal and transvaginal ultrasound examinations of the pelvis were performed. Transabdominal technique was performed for global imaging of the pelvis including uterus, ovaries, adnexal regions, and pelvic cul-de-sac. It was necessary to proceed with endovaginal exam following the transabdominal exam to visualize the adnexal regions. COMPARISON:  04/03/2018 FINDINGS: Uterus Measurements: 8.3 x 3.6 x 4.5 cm = volume: 71 mL. Anteverted. Posterior uterine body fibroid measures up to 1.8 cm. Endometrium Thickness: 6.5 mm. No focal abnormality visualized. IUD is present within the endometrial canal. Right ovary Not visualized.  Obscured by overlying bowel gas. Left ovary Not visualized.  Obscured by overlying bowel gas. Other findings No abnormal free fluid. IMPRESSION: 1. Nonvisualization of the  bilateral ovaries, which were obscured by overlying bowel gas. Adnexal torsion is unable to be assessed by this study. 2. Small posterior uterine body fibroid. 3. No free fluid within the pelvis. Electronically Signed   By: Davina Poke D.O.   On: 07/19/2021 16:50   CT ABDOMEN PELVIS W CONTRAST  Result Date: 07/19/2021 CLINICAL DATA:  Right lower quadrant pain EXAM: CT ABDOMEN AND PELVIS WITH CONTRAST TECHNIQUE: Multidetector CT imaging of the abdomen and pelvis was performed using the standard protocol following bolus administration of intravenous contrast. RADIATION DOSE REDUCTION: This exam was performed according to the departmental dose-optimization program which includes automated exposure control, adjustment of the mA and/or kV according to patient size and/or use of iterative reconstruction technique. CONTRAST:  13mL OMNIPAQUE IOHEXOL 350 MG/ML SOLN COMPARISON:  None. FINDINGS: Lower chest: No acute abnormality Hepatobiliary: Tiny scattered hypodensities in the liver, likely small cysts. The largest is in the posterior right hepatic lobe measuring 8 mm. Gallbladder unremarkable. Pancreas: No focal abnormality or ductal dilatation. Spleen: No focal abnormality.  Normal size. Adrenals/Urinary Tract: Small left adrenal nodule measuring 12 mm, difficult to characterize due to its small size but likely adenoma. No renal mass or stones. No hydronephrosis. Urinary bladder unremarkable. Stomach/Bowel: Normal appendix. Stomach, large and small bowel grossly unremarkable. Vascular/Lymphatic: No evidence of aneurysm or adenopathy. Reproductive: Uterus and adnexa unremarkable. No mass. IUD within the uterus. Other: No free fluid or free air. Musculoskeletal: No acute bony abnormality. IMPRESSION: No acute findings in the abdomen or pelvis. Small scattered hypodensities in the liver, likely small cysts. 12 mm nodule in the left adrenal gland, nonspecific but statistically most likely reflects adenoma. This could  be further characterized with adrenal protocol MRI if felt clinically relevant. Electronically Signed   By: Rolm Baptise M.D.   On: 07/19/2021 18:07    Procedures Procedures    Medications Ordered in ED Medications  acetaminophen (TYLENOL) tablet 650 mg (650 mg Oral Given 07/19/21 1533)  fentaNYL (SUBLIMAZE) injection 50 mcg (50 mcg Intravenous Given 07/19/21 1609)  iohexol (OMNIPAQUE) 350 MG/ML injection 100 mL (100 mLs Intravenous Contrast Given 07/19/21 1745)  fentaNYL (SUBLIMAZE) injection 50 mcg (50 mcg Intravenous Given 07/19/21 2047)  potassium chloride SA (KLOR-CON M) CR tablet 40 mEq (40 mEq Oral Given 07/19/21 2117)    ED Course/ Medical Decision Making/ A&P  Medical Decision Making Amount and/or Complexity of Data Reviewed Labs: ordered. Radiology: ordered.  Risk OTC drugs. Prescription drug management.   46 year old lady presenting to the ER with concern for right lower quadrant abdominal pain.  Also reported positive pregnancy test at urgent care.  Based on this initial report of positive pregnancy test and right lower abdominal pain, concern for possibility of ectopic pregnancy.  Checked beta quant, basic labs and transvaginal ultrasound.  Beta quant is 8.  Patient last endorsed any sexual activity in November.  Therefore, I do not suspect that she is in fact pregnant at present and suspect that this very minimal elevation in her beta hCG does not represent true pregnancy.  The transvaginal ultrasound was negative for any acute gynecologic or pelvic pathology however there was nonvisualization of the bilateral ovaries which were obscured by overlying bowel gas.  Proceed with CT abdomen pelvis with IV contrast to better assess as well as evaluate for appendicitis or kidney stone.  Her uterus and adnexa were unremarkable.  No appendicitis and no kidney stones were identified.  Independently reviewed CT imaging.  Radiologist did comment on left adrenal  nodule.  Reassessed patient, discussed her lab work and imaging studies.  Noted some hypokalemia which was replaced orally today.  Recommend that she follow-up with her gynecologist regarding this minimal elevation in her beta-hCG as well as her symptoms.  Additionally recommended follow-up with her primary care doctor regarding her symptoms and her low potassium and recommended repeat next week.  Given the reassuring work-up today, feel patient can be discharged home and managed in the outpatient setting.  Pain is currently well controlled. Reviewed return precautions and discharged home.  Adult Daughter at bedside provided some additional history and was updated throughout patient's stay as well.    After the discussed management above, the patient was determined to be safe for discharge.  The patient was in agreement with this plan and all questions regarding their care were answered.  ED return precautions were discussed and the patient will return to the ED with any significant worsening of condition.         Final Clinical Impression(s) / ED Diagnoses Final diagnoses:  Right sided abdominal pain    Rx / DC Orders ED Discharge Orders     None         Lucrezia Starch, MD 07/20/21 [phone]

## 2021-12-27 ENCOUNTER — Encounter: Payer: Self-pay | Admitting: Obstetrics and Gynecology

## 2021-12-27 ENCOUNTER — Other Ambulatory Visit (HOSPITAL_COMMUNITY)
Admission: RE | Admit: 2021-12-27 | Discharge: 2021-12-27 | Disposition: A | Discharge status: Home / Self Care | Payer: Commercial Managed Care - PPO | Source: Ambulatory Visit | Attending: Obstetrics and Gynecology | Admitting: Obstetrics and Gynecology

## 2021-12-27 ENCOUNTER — Ambulatory Visit (INDEPENDENT_AMBULATORY_CARE_PROVIDER_SITE_OTHER): Payer: No Typology Code available for payment source | Admitting: Obstetrics and Gynecology

## 2021-12-27 VITALS — BP 122/90 | HR 92

## 2021-12-27 DIAGNOSIS — N760 Acute vaginitis: Secondary | ICD-10-CM | POA: Diagnosis present

## 2021-12-27 DIAGNOSIS — Z1159 Encounter for screening for other viral diseases: Secondary | ICD-10-CM | POA: Diagnosis not present

## 2021-12-27 DIAGNOSIS — Z114 Encounter for screening for human immunodeficiency virus [HIV]: Secondary | ICD-10-CM

## 2021-12-27 DIAGNOSIS — R1030 Lower abdominal pain, unspecified: Secondary | ICD-10-CM

## 2021-12-27 DIAGNOSIS — Z113 Encounter for screening for infections with a predominantly sexual mode of transmission: Secondary | ICD-10-CM | POA: Insufficient documentation

## 2021-12-27 DIAGNOSIS — R82998 Other abnormal findings in urine: Secondary | ICD-10-CM

## 2021-12-27 LAB — PREGNANCY, URINE: Preg Test, Ur: NEGATIVE

## 2021-12-27 MED ORDER — SULFAMETHOXAZOLE-TRIMETHOPRIM 800-160 MG PO TABS: 1.0000 | ORAL_TABLET | Freq: Two times a day (BID) | ORAL | 0 refills | Status: AC

## 2021-12-27 MED ORDER — IBUPROFEN 800 MG PO TABS: 800.0000 mg | ORAL_TABLET | Freq: Three times a day (TID) | ORAL | 0 refills | Status: AC | PRN

## 2021-12-27 NOTE — Progress Notes (Signed)
GYNECOLOGY  VISIT   HPI: 46 y.o.   Single  African American  female   [phone] with LMP none due to IUD.    here for pt reports +UPT @ [facility] in 06/2021 when gone for lower back pains but had immediately gone to ER afterwards and was confirmed not pregnant but is now experiencing general lower abdominal pains x 1 week. Denies any urinary problems besides dark urine color.  Denies dysuria.  Reports BMs are regular. Just reports being under a lot of stress lately.  UPT negative on 07/31/21 at [facility].  See Care Everywhere.   Has midline lower abdominal cramping for one week.   Having some cramping in her stomach.  Some discharge and some odor.  2 partners. One with and one without condom.  She did have coital bleeding.   Brother had a car accident as in hospital in [place].   GYNECOLOGIC HISTORY: LMP none due to IUD.  Contraception: tubal/Mirena IUD 10-20-15 Menopausal hormone therapy: n/a Last mammogram: 07-08-17 3D/Neg/Density B/BiRads1 Last pap smear: 03-23-21 ASCUS:Neg HR HPV, 04-24-18 Neg:Neg HR HPV, 02-23-15 Neg:neg HR HPV        OB History     Gravida  3   Para  2   Term  2   Preterm  0   AB  0   Living  2      SAB  0   IAB  0   Ectopic  0   Multiple  0   Live Births  2              Patient Active Problem List   Diagnosis Date Noted   Solitary thyroid nodule 10/18/2017   Subclinical hypothyroidism 10/18/2017   Anxiety state 03/25/2017    Past Medical History:  Diagnosis Date   Anemia    Anxiety    Migraine    with aura   Thyroid disease    sees someone in [place], [place]--Dr.Willaim Granville    Past Surgical History:  Procedure Laterality Date   TUBAL LIGATION      Current Outpatient Medications  Medication Sig Dispense Refill   albuterol (VENTOLIN HFA) 108 (90 Base) MCG/ACT inhaler Inhale into the lungs.     azelastine (OPTIVAR) 0.05 % ophthalmic solution Place 1 drop into both eyes daily.     buPROPion (WELLBUTRIN) 75 MG tablet Take 1 tablet by mouth  daily.     cetirizine-pseudoephedrine (ZYRTEC-D) 5-120 MG tablet Take by mouth.     clonazePAM (KLONOPIN) 0.5 MG tablet Take 1 tablet by mouth as needed.     cyclobenzaprine (FLEXERIL) 10 MG tablet Take 1 tablet by mouth as needed.     escitalopram (LEXAPRO) 20 MG tablet Take 1 tablet by mouth daily.     fluticasone (FLONASE) 50 MCG/ACT nasal spray Place 1 spray into the nose as needed.     levonorgestrel (MIRENA) 20 MCG/24HR IUD by Intrauterine route.     SUMAtriptan (IMITREX) 50 MG tablet Take one tab po at onset of migraine. May repeat 1 time after two hours if needed. No more than 2 in 24 hours     traZODone (DESYREL) 50 MG tablet Take 1 tablet by mouth at bedtime as needed.  0   topiramate (TOPAMAX) 25 MG tablet Take 25 mg by mouth.     No current facility-administered medications for this visit.     ALLERGIES: Patient has no known allergies.  Family History  Problem Relation Age of Onset   Cancer Father  34       prostate ca   Leukemia Father        passed from Leukemia   Stroke Maternal Uncle    Cancer Paternal Uncle 5       colon cancer   Cancer Maternal Grandmother        dec stomach ca?   Cancer Paternal Grandfather        dec stomach ca?    Social History   Socioeconomic History   Marital status: Single    Spouse name: Not on file   Number of children: Not on file   Years of education: Not on file   Highest education level: Not on file  Occupational History   Not on file  Tobacco Use   Smoking status: Never   Smokeless tobacco: Never  Vaping Use   Vaping Use: Never used  Substance and Sexual Activity   Alcohol use: Yes    Alcohol/week: 2.0 standard drinks of alcohol    Types: 2 Glasses of wine per week   Drug use: No   Sexual activity: Not Currently    Partners: Male    Birth control/protection: Condom, I.U.D., Surgical    Comment: BTL/Mirena IUD inserted 10-20-15  Other Topics Concern   Not on file  Social History Narrative   Not on file   Social  Determinants of Health   Financial Resource Strain: Not on file  Food Insecurity: Not on file  Transportation Needs: Not on file  Physical Activity: Not on file  Stress: Not on file  Social Connections: Not on file  Intimate Partner Violence: Not on file    Review of Systems  Genitourinary:  Positive for pelvic pain.  All other systems reviewed and are negative.   PHYSICAL EXAMINATION:    BP 122/90   Pulse 92   LMP  (LMP Unknown) Comment: IUD  SpO2 97%   Breastfeeding Unknown     General appearance: alert, cooperative and appears stated age   Pelvic: External genitalia:  no lesions              Urethra:  normal appearing urethra with no masses, tenderness or lesions              Bartholins and Skenes: normal                 Vagina: normal appearing vagina with normal color and discharge, no lesions              Cervix: no lesions.  IUD strings noted.   No CMT.                 Bimanual Exam:  Uterus:  normal size, contour, position, consistency, mobility, non-tender              Adnexa: no mass, fullness, tenderness   Chaperone was present for exam:  Lovena Le, CMA  ASSESSMENT  Lower abdominal pain.  Dark urine.  Possible UTI.  Mirena IUD.  Vaginitis.  STD screening.   PLAN  UPT - negative.  Vaginitis testing.  STD screening.  Urinalysis:  sg 1.025, pH 5.5, 6 - 10 WBC, 0 - 2 RBC, 20 - 40 squams, many bacteria.  UC sent.  Bactrim DS po bid x 3 days.  Motrin 800 mg po q 8 hours prn.  I discussed interaction between Motrin and Lexapro, which increases risk of GI  bleeding.  She will return for pelvic US if symptoms persist.   An After Visit  Summary was printed and given to the patient.  28 min  total time was spent for this patient encounter, including preparation, face-to-face counseling with the patient, coordination of care, and documentation of the encounter.

## 2021-12-28 LAB — CERVICOVAGINAL ANCILLARY ONLY
Bacterial Vaginitis (gardnerella): POSITIVE — AB
Candida Glabrata: NEGATIVE
Candida Vaginitis: NEGATIVE
Chlamydia: NEGATIVE
Comment: NEGATIVE
Comment: NEGATIVE
Comment: NEGATIVE
Comment: NEGATIVE
Comment: NEGATIVE
Comment: NORMAL
Neisseria Gonorrhea: NEGATIVE
Trichomonas: NEGATIVE

## 2021-12-28 LAB — HEPATITIS C ANTIBODY: Hepatitis C Ab: NONREACTIVE

## 2021-12-28 LAB — RPR: RPR Ser Ql: NONREACTIVE

## 2021-12-28 LAB — HIV ANTIBODY (ROUTINE TESTING W REFLEX): HIV 1&2 Ab, 4th Generation: NONREACTIVE

## 2021-12-29 ENCOUNTER — Other Ambulatory Visit: Payer: Self-pay | Admitting: *Deleted

## 2021-12-29 LAB — URINALYSIS, COMPLETE W/RFL CULTURE
Glucose, UA: NEGATIVE
Hyaline Cast: NONE SEEN /LPF
Nitrites, Initial: NEGATIVE
Specific Gravity, Urine: 1.025 (ref 1.001–1.035)
pH: 5.5 (ref 5.0–8.0)

## 2021-12-29 LAB — URINE CULTURE
MICRO NUMBER:: 13606350
SPECIMEN QUALITY:: ADEQUATE

## 2021-12-29 LAB — CULTURE INDICATED

## 2021-12-29 MED ORDER — METRONIDAZOLE 0.75 % VA GEL: 1.0000 | Freq: Every day | VAGINAL | 0 refills | Status: AC

## 2022-07-01 IMAGING — CT CT ABD-PELV W/ CM
2 of 5 series · 16 of 46 positions shown, 18 images · IV contrast (agent unspecified)
Comparison: None.

CLINICAL DATA: Right lower quadrant pain

EXAM:
CT ABDOMEN AND PELVIS WITH CONTRAST
TECHNIQUE: Multidetector CT imaging of the abdomen and pelvis was performed
using the standard protocol following bolus administration of
intravenous contrast.

[Series 2: axial st · axial · 0.72mm/px · z∈[+1076,+1476]mm · 13 of 92 slices shown, 15 images]
[im 6/92  soft-tissue]
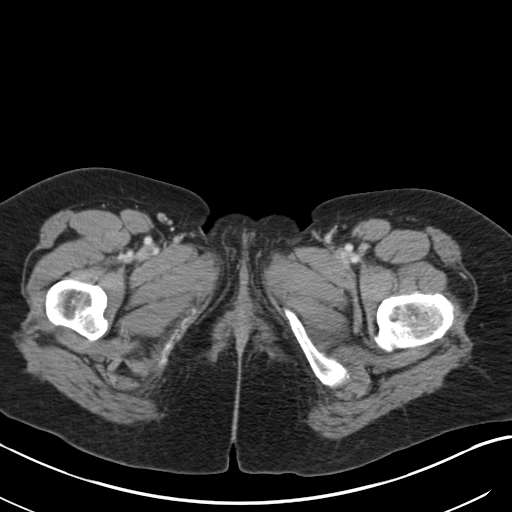
[im 6/92  bone]
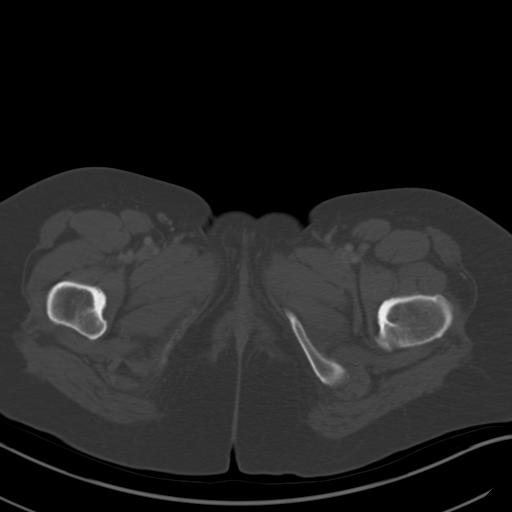
[im 12/92  soft-tissue]
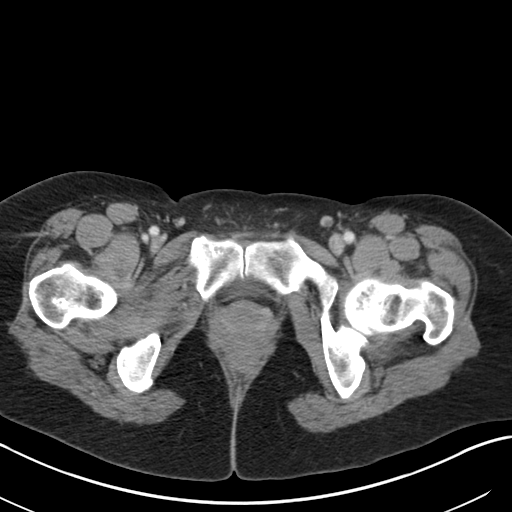
[im 18/92  soft-tissue]
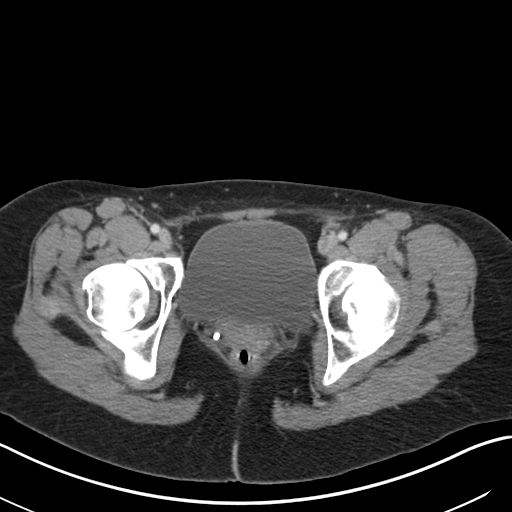
[im 29/92  soft-tissue]
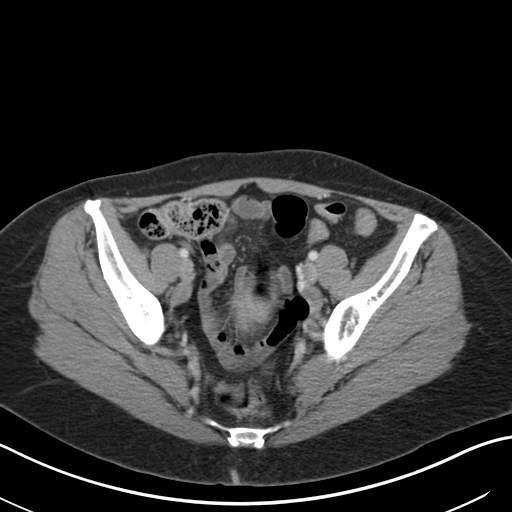
[im 35/92  soft-tissue]
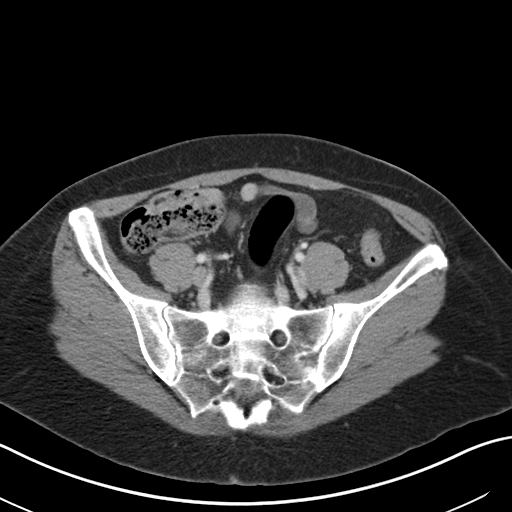
[im 40/92  soft-tissue]
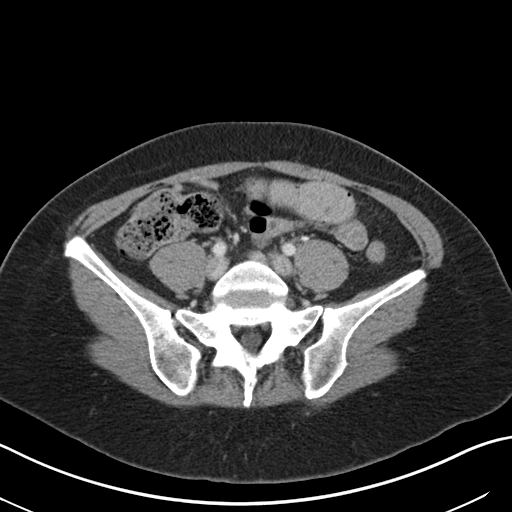
[im 46/92  soft-tissue]
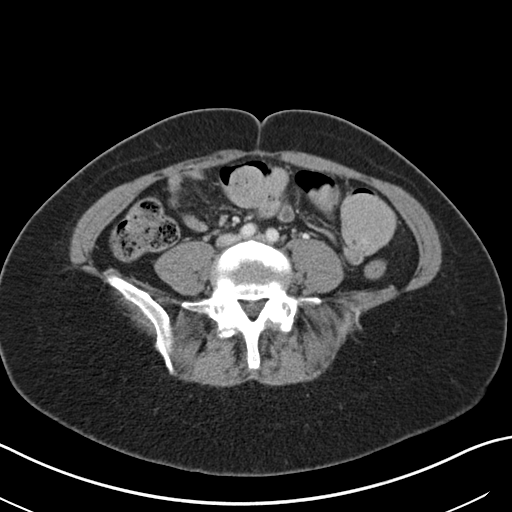
[im 52/92  soft-tissue]
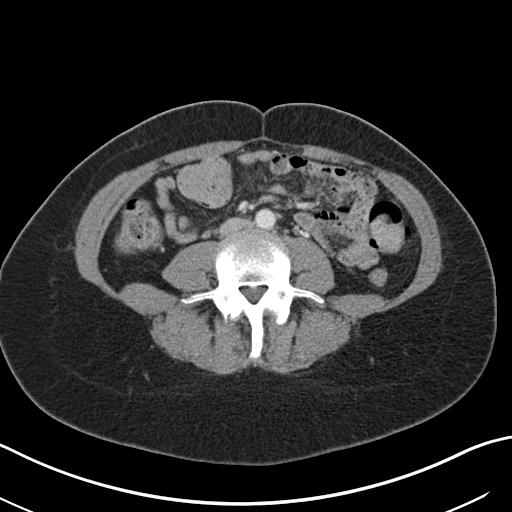
[im 57/92  soft-tissue]
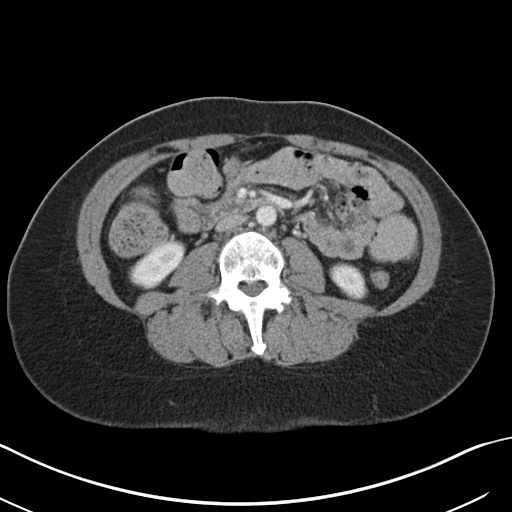
[im 57/92  bone]
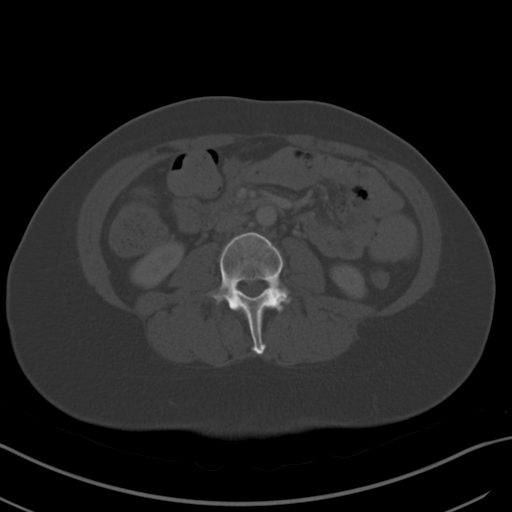
[im 63/92  soft-tissue]
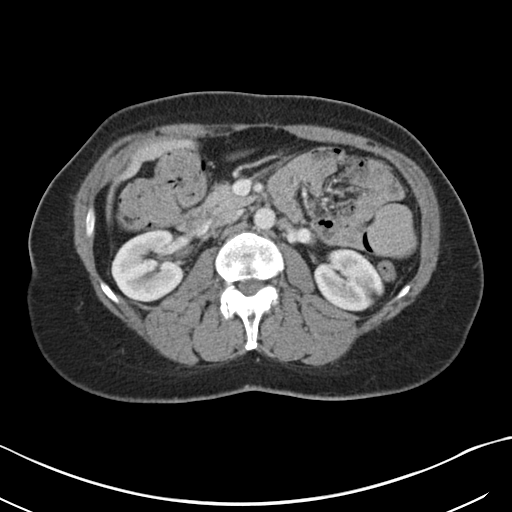
[im 74/92  soft-tissue]
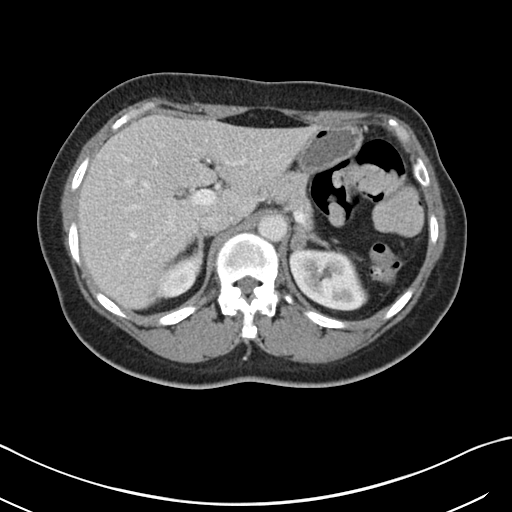
[im 80/92  soft-tissue]
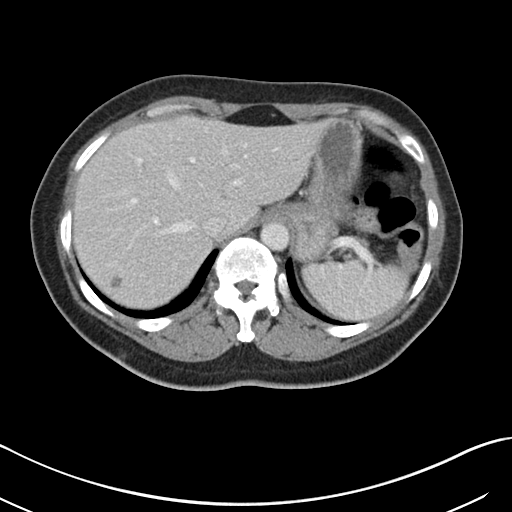
[im 86/92  soft-tissue]
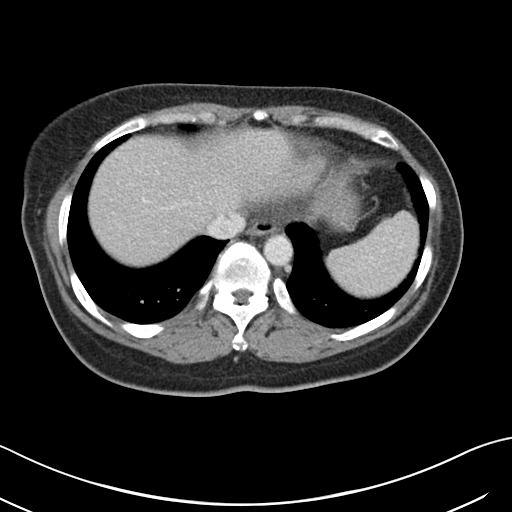

[Series 5: coronal st · coronal · 0.70mm/px · 3 of 149 slices shown]
[im 50/149  soft-tissue]
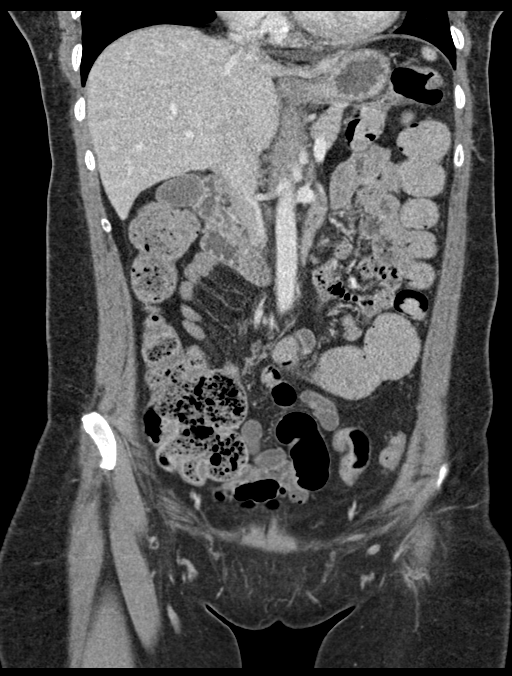
[im 66/149  soft-tissue]
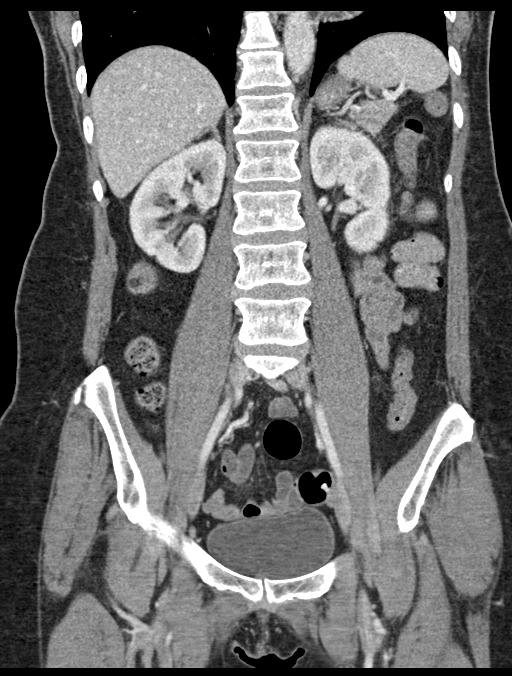
[im 83/149  soft-tissue]
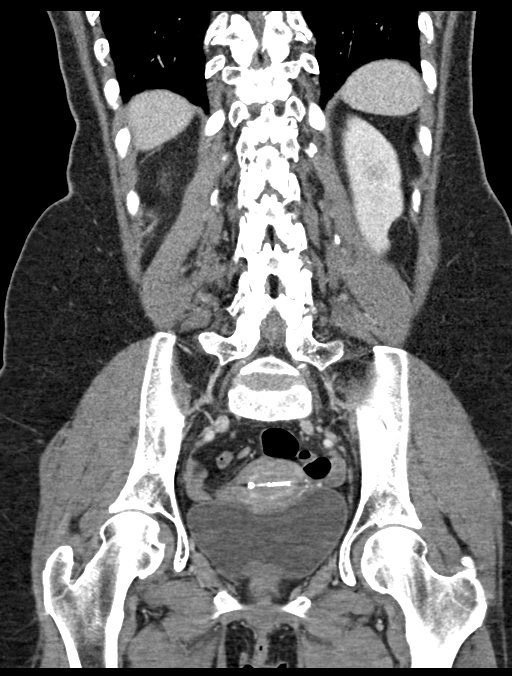

[16 of 46 positions shown; findings below may reference images not displayed]

RADIATION DOSE REDUCTION: This exam was performed according to the
departmental dose-optimization program which includes automated
exposure control, adjustment of the mA and/or kV according to
patient size and/or use of iterative reconstruction technique.

CONTRAST:  100mL OMNIPAQUE IOHEXOL 350 MG/ML SOLN
FINDINGS: Lower chest: No acute abnormality

Hepatobiliary: Tiny scattered hypodensities in the liver, likely
small cysts. The largest is in the posterior right hepatic lobe
measuring 8 mm. Gallbladder unremarkable.

Pancreas: No focal abnormality or ductal dilatation.

Spleen: No focal abnormality.  Normal size.

Adrenals/Urinary Tract: Small left adrenal nodule measuring 12 mm,
difficult to characterize due to its small size but likely adenoma.
No renal mass or stones. No hydronephrosis. Urinary bladder
unremarkable.

Stomach/Bowel: Normal appendix. Stomach, large and small bowel
grossly unremarkable.

Vascular/Lymphatic: No evidence of aneurysm or adenopathy.

Reproductive: Uterus and adnexa unremarkable. No mass. IUD within
the uterus.

Other: No free fluid or free air.

Musculoskeletal: No acute bony abnormality.
IMPRESSION: No acute findings in the abdomen or pelvis.

Small scattered hypodensities in the liver, likely small cysts.

12 mm nodule in the left adrenal gland, nonspecific but
statistically most likely reflects adenoma. This could be further
characterized with adrenal protocol MRI if felt clinically relevant.
